# Patient Record
Sex: Male | Born: 1940 | Race: White | Hispanic: No | Marital: Married | Smoking: Light tobacco smoker
Health system: Southern US, Community
[De-identification: ages and names within clinical notes are randomized; demographics above are authoritative.]

## PROBLEM LIST (undated history)

## (undated) DIAGNOSIS — I10 Essential (primary) hypertension: Secondary | ICD-10-CM

## (undated) DIAGNOSIS — T8859XA Other complications of anesthesia, initial encounter: Secondary | ICD-10-CM

## (undated) DIAGNOSIS — F419 Anxiety disorder, unspecified: Secondary | ICD-10-CM

## (undated) DIAGNOSIS — M1990 Unspecified osteoarthritis, unspecified site: Secondary | ICD-10-CM

## (undated) DIAGNOSIS — J302 Other seasonal allergic rhinitis: Secondary | ICD-10-CM

## (undated) DIAGNOSIS — T4145XA Adverse effect of unspecified anesthetic, initial encounter: Secondary | ICD-10-CM

## (undated) DIAGNOSIS — F0390 Unspecified dementia without behavioral disturbance: Secondary | ICD-10-CM

## (undated) HISTORY — PX: OTHER SURGICAL HISTORY: SHX169

## (undated) HISTORY — PX: CATARACT EXTRACTION, BILATERAL: SHX1313

## (undated) HISTORY — DX: Unspecified dementia, unspecified severity, without behavioral disturbance, psychotic disturbance, mood disturbance, and anxiety: F03.90

---

## 2013-02-25 ENCOUNTER — Inpatient Hospital Stay (HOSPITAL_COMMUNITY)
Admission: EM | Admit: 2013-02-25 | Discharge: 2013-03-01 | DRG: 481 | Disposition: A | Discharge status: Skilled Nursing Facility | Payer: Medicare Other | Attending: Internal Medicine | Admitting: Internal Medicine

## 2013-02-25 ENCOUNTER — Inpatient Hospital Stay (HOSPITAL_COMMUNITY): Payer: Medicare Other

## 2013-02-25 ENCOUNTER — Emergency Department (HOSPITAL_COMMUNITY): Payer: Medicare Other

## 2013-02-25 ENCOUNTER — Inpatient Hospital Stay (HOSPITAL_COMMUNITY): Payer: Medicare Other | Admitting: Anesthesiology

## 2013-02-25 ENCOUNTER — Encounter (HOSPITAL_COMMUNITY): Payer: Self-pay | Admitting: Emergency Medicine

## 2013-02-25 ENCOUNTER — Encounter (HOSPITAL_COMMUNITY): Payer: Self-pay | Admitting: Anesthesiology

## 2013-02-25 ENCOUNTER — Encounter (HOSPITAL_COMMUNITY)
Admission: EM | Disposition: A | Discharge status: Skilled Nursing Facility | Payer: Self-pay | Source: Home / Self Care | Attending: Internal Medicine

## 2013-02-25 DIAGNOSIS — I10 Essential (primary) hypertension: Secondary | ICD-10-CM | POA: Diagnosis present

## 2013-02-25 DIAGNOSIS — S72143A Displaced intertrochanteric fracture of unspecified femur, initial encounter for closed fracture: Principal | ICD-10-CM

## 2013-02-25 DIAGNOSIS — R410 Disorientation, unspecified: Secondary | ICD-10-CM

## 2013-02-25 DIAGNOSIS — N179 Acute kidney failure, unspecified: Secondary | ICD-10-CM | POA: Diagnosis present

## 2013-02-25 DIAGNOSIS — I498 Other specified cardiac arrhythmias: Secondary | ICD-10-CM | POA: Diagnosis not present

## 2013-02-25 DIAGNOSIS — E876 Hypokalemia: Secondary | ICD-10-CM | POA: Diagnosis present

## 2013-02-25 DIAGNOSIS — D72829 Elevated white blood cell count, unspecified: Secondary | ICD-10-CM | POA: Diagnosis present

## 2013-02-25 DIAGNOSIS — R404 Transient alteration of awareness: Secondary | ICD-10-CM | POA: Diagnosis not present

## 2013-02-25 DIAGNOSIS — S72142A Displaced intertrochanteric fracture of left femur, initial encounter for closed fracture: Secondary | ICD-10-CM

## 2013-02-25 DIAGNOSIS — Z79899 Other long term (current) drug therapy: Secondary | ICD-10-CM

## 2013-02-25 DIAGNOSIS — W11XXXA Fall on and from ladder, initial encounter: Secondary | ICD-10-CM | POA: Diagnosis present

## 2013-02-25 DIAGNOSIS — F17200 Nicotine dependence, unspecified, uncomplicated: Secondary | ICD-10-CM | POA: Diagnosis present

## 2013-02-25 DIAGNOSIS — E871 Hypo-osmolality and hyponatremia: Secondary | ICD-10-CM | POA: Diagnosis present

## 2013-02-25 DIAGNOSIS — S72002A Fracture of unspecified part of neck of left femur, initial encounter for closed fracture: Secondary | ICD-10-CM

## 2013-02-25 DIAGNOSIS — Z880 Allergy status to penicillin: Secondary | ICD-10-CM

## 2013-02-25 DIAGNOSIS — F0390 Unspecified dementia without behavioral disturbance: Secondary | ICD-10-CM | POA: Diagnosis present

## 2013-02-25 DIAGNOSIS — Y92009 Unspecified place in unspecified non-institutional (private) residence as the place of occurrence of the external cause: Secondary | ICD-10-CM

## 2013-02-25 HISTORY — PX: FEMUR IM NAIL: SHX1597

## 2013-02-25 HISTORY — DX: Essential (primary) hypertension: I10

## 2013-02-25 LAB — COMPREHENSIVE METABOLIC PANEL
AST: 38 U/L — ABNORMAL HIGH (ref 0–37)
Albumin: 3.7 g/dL (ref 3.5–5.2)
BUN: 41 mg/dL — ABNORMAL HIGH (ref 6–23)
Creatinine, Ser: 1.38 mg/dL — ABNORMAL HIGH (ref 0.50–1.35)
Potassium: 3.4 mEq/L — ABNORMAL LOW (ref 3.5–5.1)
Total Protein: 7.4 g/dL (ref 6.0–8.3)

## 2013-02-25 LAB — CBC
HCT: 34.1 % — ABNORMAL LOW (ref 39.0–52.0)
MCHC: 39.3 g/dL — ABNORMAL HIGH (ref 30.0–36.0)
MCV: 95.5 fL (ref 78.0–100.0)
Platelets: 319 10*3/uL (ref 150–400)
RDW: 11.2 % — ABNORMAL LOW (ref 11.5–15.5)
WBC: 14.7 10*3/uL — ABNORMAL HIGH (ref 4.0–10.5)

## 2013-02-25 LAB — ABO/RH: ABO/RH(D): B POS

## 2013-02-25 LAB — URINALYSIS, ROUTINE W REFLEX MICROSCOPIC
Glucose, UA: NEGATIVE mg/dL
Hgb urine dipstick: NEGATIVE
Leukocytes, UA: NEGATIVE
pH: 6 (ref 5.0–8.0)

## 2013-02-25 LAB — TYPE AND SCREEN
ABO/RH(D): B POS
Antibody Screen: NEGATIVE

## 2013-02-25 LAB — PROTIME-INR
INR: 0.92 (ref 0.00–1.49)
Prothrombin Time: 12.3 seconds (ref 11.6–15.2)

## 2013-02-25 LAB — POCT I-STAT, CHEM 8
Creatinine, Ser: 1.1 mg/dL (ref 0.50–1.35)
Hemoglobin: 11.2 g/dL — ABNORMAL LOW (ref 13.0–17.0)
Sodium: 124 mEq/L — ABNORMAL LOW (ref 135–145)
TCO2: 24 mmol/L (ref 0–100)

## 2013-02-25 SURGERY — INSERTION, INTRAMEDULLARY ROD, FEMUR
Anesthesia: General | Site: Hip | Laterality: Left | Wound class: Clean

## 2013-02-25 MED ORDER — ENOXAPARIN SODIUM 40 MG/0.4ML ~~LOC~~ SOLN
40.0000 mg | SUBCUTANEOUS | Status: DC
  Administered 2013-02-26 – 2013-03-01 (×4): 40 mg via SUBCUTANEOUS
  Filled 2013-02-25 (×5): qty 0.4

## 2013-02-25 MED ORDER — MORPHINE SULFATE 2 MG/ML IJ SOLN: 0.5000 mg | INTRAMUSCULAR | Status: DC | PRN

## 2013-02-25 MED ORDER — PROMETHAZINE HCL 25 MG/ML IJ SOLN: 6.2500 mg | INTRAMUSCULAR | Status: DC | PRN

## 2013-02-25 MED ORDER — SODIUM CHLORIDE 0.9 % IV SOLN
1000.0000 mL | INTRAVENOUS | Status: DC
  Administered 2013-02-25: 1000 mL via INTRAVENOUS

## 2013-02-25 MED ORDER — ACETAMINOPHEN 325 MG PO TABS: 650.0000 mg | ORAL_TABLET | Freq: Four times a day (QID) | ORAL | Status: DC | PRN

## 2013-02-25 MED ORDER — LACTATED RINGERS IV SOLN: INTRAVENOUS | Status: DC

## 2013-02-25 MED ORDER — PHENOL 1.4 % MT LIQD: 1.0000 | OROMUCOSAL | Status: DC | PRN

## 2013-02-25 MED ORDER — ACETAMINOPHEN 650 MG RE SUPP: 650.0000 mg | Freq: Four times a day (QID) | RECTAL | Status: DC | PRN

## 2013-02-25 MED ORDER — POTASSIUM CHLORIDE CRYS ER 20 MEQ PO TBCR
40.0000 meq | EXTENDED_RELEASE_TABLET | Freq: Once | ORAL | Status: AC
  Administered 2013-02-25: 40 meq via ORAL
  Filled 2013-02-25: qty 2

## 2013-02-25 MED ORDER — HYDROCODONE-ACETAMINOPHEN 5-325 MG PO TABS: 1.0000 | ORAL_TABLET | Freq: Four times a day (QID) | ORAL | Status: DC | PRN

## 2013-02-25 MED ORDER — ONDANSETRON HCL 4 MG/2ML IJ SOLN: 4.0000 mg | Freq: Four times a day (QID) | INTRAMUSCULAR | Status: DC | PRN

## 2013-02-25 MED ORDER — FENTANYL CITRATE 0.05 MG/ML IJ SOLN
INTRAMUSCULAR | Status: AC
  Filled 2013-02-25: qty 2

## 2013-02-25 MED ORDER — PHENYLEPHRINE HCL 10 MG/ML IJ SOLN
10.0000 mg | INTRAVENOUS | Status: DC | PRN
  Administered 2013-02-25: 20 ug/min via INTRAVENOUS

## 2013-02-25 MED ORDER — LABETALOL HCL 5 MG/ML IV SOLN
5.0000 mg | INTRAVENOUS | Status: DC | PRN
  Administered 2013-02-25 (×3): 5 mg via INTRAVENOUS

## 2013-02-25 MED ORDER — PROPOFOL 10 MG/ML IV BOLUS
INTRAVENOUS | Status: DC | PRN
  Administered 2013-02-25: 160 mg via INTRAVENOUS

## 2013-02-25 MED ORDER — ONDANSETRON HCL 4 MG/2ML IJ SOLN
INTRAMUSCULAR | Status: DC | PRN
  Administered 2013-02-25: 4 mg via INTRAVENOUS

## 2013-02-25 MED ORDER — METOCLOPRAMIDE HCL 10 MG PO TABS: 5.0000 mg | ORAL_TABLET | Freq: Three times a day (TID) | ORAL | Status: DC | PRN

## 2013-02-25 MED ORDER — PHENYLEPHRINE HCL 10 MG/ML IJ SOLN
INTRAMUSCULAR | Status: DC | PRN
  Administered 2013-02-25 (×2): 240 ug via INTRAVENOUS

## 2013-02-25 MED ORDER — LACTATED RINGERS IV SOLN
INTRAVENOUS | Status: DC | PRN
  Administered 2013-02-25 (×2): via INTRAVENOUS

## 2013-02-25 MED ORDER — MENTHOL 3 MG MT LOZG: 1.0000 | LOZENGE | OROMUCOSAL | Status: DC | PRN

## 2013-02-25 MED ORDER — ACETAMINOPHEN 10 MG/ML IV SOLN
INTRAVENOUS | Status: AC
  Filled 2013-02-25: qty 100

## 2013-02-25 MED ORDER — CLINDAMYCIN PHOSPHATE 900 MG/50ML IV SOLN
INTRAVENOUS | Status: AC
  Filled 2013-02-25: qty 50

## 2013-02-25 MED ORDER — METOCLOPRAMIDE HCL 5 MG/ML IJ SOLN: 5.0000 mg | Freq: Three times a day (TID) | INTRAMUSCULAR | Status: DC | PRN

## 2013-02-25 MED ORDER — FENTANYL CITRATE 0.05 MG/ML IJ SOLN
25.0000 ug | INTRAMUSCULAR | Status: DC | PRN
  Administered 2013-02-25: 25 ug via INTRAVENOUS

## 2013-02-25 MED ORDER — HYDRALAZINE HCL 20 MG/ML IJ SOLN
10.0000 mg | Freq: Four times a day (QID) | INTRAMUSCULAR | Status: DC | PRN
  Administered 2013-02-27 – 2013-02-28 (×3): 10 mg via INTRAVENOUS
  Filled 2013-02-25 (×2): qty 1
  Filled 2013-02-25: qty 0.5
  Filled 2013-02-25: qty 1

## 2013-02-25 MED ORDER — FENTANYL CITRATE 0.05 MG/ML IJ SOLN
INTRAMUSCULAR | Status: DC | PRN
  Administered 2013-02-25 (×3): 50 ug via INTRAVENOUS

## 2013-02-25 MED ORDER — LABETALOL HCL 5 MG/ML IV SOLN
INTRAVENOUS | Status: AC
  Filled 2013-02-25: qty 4

## 2013-02-25 MED ORDER — ONDANSETRON HCL 4 MG PO TABS: 4.0000 mg | ORAL_TABLET | Freq: Four times a day (QID) | ORAL | Status: DC | PRN

## 2013-02-25 MED ORDER — SODIUM CHLORIDE 0.9 % IV BOLUS (SEPSIS)
1000.0000 mL | Freq: Once | INTRAVENOUS | Status: AC
  Administered 2013-02-25: 1000 mL via INTRAVENOUS

## 2013-02-25 MED ORDER — CLINDAMYCIN PHOSPHATE 900 MG/50ML IV SOLN
900.0000 mg | Freq: Once | INTRAVENOUS | Status: AC
  Administered 2013-02-25: 900 mg via INTRAVENOUS

## 2013-02-25 MED ORDER — LIDOCAINE HCL (CARDIAC) 20 MG/ML IV SOLN
INTRAVENOUS | Status: DC | PRN
  Administered 2013-02-25: 50 mg via INTRAVENOUS

## 2013-02-25 MED ORDER — SUCCINYLCHOLINE CHLORIDE 20 MG/ML IJ SOLN
INTRAMUSCULAR | Status: DC | PRN
  Administered 2013-02-25: 100 mg via INTRAVENOUS

## 2013-02-25 MED ORDER — LOVENOX 40 MG/0.4ML ~~LOC~~ SOLN: 40.0000 mg | SUBCUTANEOUS | Status: DC

## 2013-02-25 MED ORDER — POTASSIUM CHLORIDE IN NACL 20-0.9 MEQ/L-% IV SOLN
INTRAVENOUS | Status: DC
  Administered 2013-02-26: via INTRAVENOUS
  Filled 2013-02-25 (×2): qty 1000

## 2013-02-25 MED ORDER — ALBUTEROL SULFATE HFA 108 (90 BASE) MCG/ACT IN AERS
INHALATION_SPRAY | RESPIRATORY_TRACT | Status: AC
  Filled 2013-02-25: qty 6.7

## 2013-02-25 MED ORDER — LIDOCAINE HCL 4 % MT SOLN
OROMUCOSAL | Status: DC | PRN
  Administered 2013-02-25: 4 mL via TOPICAL

## 2013-02-25 MED ORDER — CLINDAMYCIN PHOSPHATE 600 MG/50ML IV SOLN
600.0000 mg | Freq: Four times a day (QID) | INTRAVENOUS | Status: AC
  Administered 2013-02-26 (×2): 600 mg via INTRAVENOUS
  Filled 2013-02-25 (×2): qty 50

## 2013-02-25 SURGICAL SUPPLY — 40 items
BAG ZIPLOCK 12X15 (MISCELLANEOUS) ×2 IMPLANT
BANDAGE GAUZE ELAST BULKY 4 IN (GAUZE/BANDAGES/DRESSINGS) ×2 IMPLANT
BIT DRILL CANN LG 4.3MM (BIT) ×1 IMPLANT
BNDG COHESIVE 6X5 TAN STRL LF (GAUZE/BANDAGES/DRESSINGS) ×2 IMPLANT
CLOTH BEACON ORANGE TIMEOUT ST (SAFETY) IMPLANT
DRAPE C-ARM 42X72 X-RAY (DRAPES) IMPLANT
DRAPE STERI IOBAN 125X83 (DRAPES) ×2 IMPLANT
DRILL BIT CANN LG 4.3MM (BIT) ×2
DRSG MEPILEX BORDER 4X4 (GAUZE/BANDAGES/DRESSINGS) ×2 IMPLANT
DRSG MEPILEX BORDER 4X8 (GAUZE/BANDAGES/DRESSINGS) ×2 IMPLANT
DRSG PAD ABDOMINAL 8X10 ST (GAUZE/BANDAGES/DRESSINGS) ×2 IMPLANT
DURAPREP 26ML APPLICATOR (WOUND CARE) ×2 IMPLANT
ELECT REM PT RETURN 9FT ADLT (ELECTROSURGICAL) ×2
ELECTRODE REM PT RTRN 9FT ADLT (ELECTROSURGICAL) ×1 IMPLANT
GLOVE BIOGEL PI IND STRL 7.5 (GLOVE) IMPLANT
GLOVE BIOGEL PI IND STRL 8 (GLOVE) IMPLANT
GLOVE BIOGEL PI INDICATOR 7.5 (GLOVE)
GLOVE BIOGEL PI INDICATOR 8 (GLOVE)
GLOVE SURG SS PI 7.5 STRL IVOR (GLOVE) ×2 IMPLANT
GLOVE SURG SS PI 8.0 STRL IVOR (GLOVE) IMPLANT
GOWN PREVENTION PLUS LG XLONG (DISPOSABLE) ×2 IMPLANT
GOWN PREVENTION PLUS XLARGE (GOWN DISPOSABLE) IMPLANT
GOWN STRL REIN XL XLG (GOWN DISPOSABLE) ×2 IMPLANT
GUIDEPIN 3.2X17.5 THRD DISP (PIN) ×2 IMPLANT
HIP FRAC NAIL LAG SCR 10.5X100 (Orthopedic Implant) ×1 IMPLANT
KIT BASIN OR (CUSTOM PROCEDURE TRAY) ×2 IMPLANT
MANIFOLD NEPTUNE II (INSTRUMENTS) ×2 IMPLANT
NAIL HIP FRACT 130D 11X180 (Screw) ×2 IMPLANT
PACK GENERAL/GYN (CUSTOM PROCEDURE TRAY) ×2 IMPLANT
PAD CAST 4YDX4 CTTN HI CHSV (CAST SUPPLIES) ×1 IMPLANT
PADDING CAST COTTON 4X4 STRL (CAST SUPPLIES) ×1
SCREW BONE CORTICAL 5.0X38 (Screw) ×2 IMPLANT
SCREW CANN THRD AFF 10.5X100 (Orthopedic Implant) ×1 IMPLANT
SPONGE GAUZE 4X4 12PLY (GAUZE/BANDAGES/DRESSINGS) ×2 IMPLANT
STAPLER VISISTAT (STAPLE) ×2 IMPLANT
SUT VIC AB 0 CT1 27 (SUTURE) ×2
SUT VIC AB 0 CT1 27XBRD ANTBC (SUTURE) ×2 IMPLANT
SUT VIC AB 2-0 CT1 27 (SUTURE) ×1
SUT VIC AB 2-0 CT1 TAPERPNT 27 (SUTURE) ×1 IMPLANT
TRAY FOLEY CATH 14FRSI W/METER (CATHETERS) ×2 IMPLANT

## 2013-02-25 NOTE — Consult Note (Signed)
Reason for Consult: Left Hip Fracture Referring Physician: EDP  Timothy Pena is an 72 y.o. male.  HPI: Timothy Pena 3d ago. No LOC. Seen in ER. Called  Past Medical History  Diagnosis Date  . Hypertension     Past Surgical History  Procedure Laterality Date  . Cataract extraction, bilateral    . Dental implants      History reviewed. No pertinent family history.  Social History:  reports that he has been smoking Cigarettes.  He has been smoking about 0.00 packs per day. He has never used smokeless tobacco. He reports that  drinks alcohol. He reports that he does not use illicit drugs.  Allergies:  Allergies  Allergen Reactions  . Penicillins Swelling    Throat swelling    Medications: I have reviewed the patient's current medications.  Results for orders placed during the hospital encounter of 02/25/13 (from the past 48 hour(s))  CBC     Status: Abnormal   Collection Time    02/25/13  9:40 AM      Result Value Range   WBC 14.7 (*) 4.0 - 10.5 K/uL   RBC 3.57 (*) 4.22 - 5.81 MIL/uL   Hemoglobin 13.4  13.0 - 17.0 g/dL   HCT 16.1 (*) 09.6 - 04.5 %   MCV 95.5  78.0 - 100.0 fL   MCH 37.5 (*) 26.0 - 34.0 pg   MCHC 39.3 (*) 30.0 - 36.0 g/dL   Comment: RULED OUT INTERFERING SUBSTANCES   RDW 11.2 (*) 11.5 - 15.5 %   Platelets 319  150 - 400 K/uL  COMPREHENSIVE METABOLIC PANEL     Status: Abnormal   Collection Time    02/25/13  9:40 AM      Result Value Range   Sodium 117 (*) 135 - 145 mEq/L   Comment: CRITICAL RESULT CALLED TO, READ BACK BY AND VERIFIED WITH:     T. HAMPTON RN AT 1100 ON 03.20.14 BY SHUEA   Potassium 3.4 (*) 3.5 - 5.1 mEq/L   Chloride 79 (*) 96 - 112 mEq/L   CO2 22  19 - 32 mEq/L   Glucose, Bld 166 (*) 70 - 99 mg/dL   BUN 41 (*) 6 - 23 mg/dL   Creatinine, Ser 4.09 (*) 0.50 - 1.35 mg/dL   Calcium 81.1  8.4 - 91.4 mg/dL   Total Protein 7.4  6.0 - 8.3 g/dL   Albumin 3.7  3.5 - 5.2 g/dL   AST 38 (*) 0 - 37 U/L   ALT 19  0 - 53 U/L   Alkaline Phosphatase 72   39 - 117 U/L   Total Bilirubin 0.5  0.3 - 1.2 mg/dL   GFR calc non Af Amer 50 (*) >90 mL/min   GFR calc Af Amer 58 (*) >90 mL/min   Comment:            The eGFR has been calculated     using the CKD EPI equation.     This calculation has not been     validated in all clinical     situations.     eGFR's persistently     <90 mL/min signify     possible Chronic Kidney Disease.  PROTIME-INR     Status: None   Collection Time    02/25/13 10:55 AM      Result Value Range   Prothrombin Time 12.3  11.6 - 15.2 seconds   INR 0.92  0.00 - 1.49    Dg Hip Complete  Left  02/25/2013  *RADIOLOGY REPORT*  Clinical Data: Timothy Pena.  Left hip pain.  LEFT HIP - COMPLETE 2+ VIEW  Comparison: None  Findings: There is a mildly displaced comminuted intertrochanteric fracture of the left hip.  Mild displacement of the greater and lesser trochanters.  The right hip is normal.  The pubic symphysis and SI joints are intact.  IMPRESSION: Intertrochanteric fracture left hip.   Original Report Authenticated By: Rudie Meyer, M.D.    Ct Head Wo Contrast  02/25/2013  *RADIOLOGY REPORT*  Clinical Data: Fall.  Confusion.  Hip pain.  CT HEAD WITHOUT CONTRAST  Technique:  Contiguous axial images were obtained from the base of the skull through the vertex without contrast.  Comparison: None.  Findings: The brain stem, cerebellum, cerebral peduncles, thalami, basal ganglia, basilar cisterns, and ventricular system appear unremarkable.  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  No intracranial hemorrhage, mass lesion, or acute infarction is identified.  There is opacification of several posterior ethmoid air cells. Atherosclerotic calcification of the carotid siphons noted.  IMPRESSION:  1. 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 2.   Chronic ethmoid sinusitis. 3.  No acute intracranial findings.   Original Report Authenticated By: Gaylyn Rong, M.D.     Review of Systems  Constitutional: Negative.   HENT: Negative.   Eyes: Negative.   Respiratory: Negative.   Cardiovascular: Negative.   Gastrointestinal: Negative.   Genitourinary: Negative.   Musculoskeletal: Positive for joint pain.  Skin: Negative.   Neurological: Negative.   Endo/Heme/Allergies: Negative.   Psychiatric/Behavioral: Negative.   All other systems reviewed and are negative.   Blood pressure 126/89, pulse 95, temperature 98.5 F (36.9 C), temperature source Rectal, resp. rate 22, SpO2 95.00%. Physical Exam  Vitals reviewed. Constitutional: He is oriented to person, place, and time. He appears well-developed.  HENT:  Head: Normocephalic.  Eyes: Pupils are equal, round, and reactive to light.  Neck: Normal range of motion.  Cardiovascular: Normal rate.   Respiratory: Effort normal.  GI: Soft.  Musculoskeletal:  Short ER left leg. Painful ROM. No DVT. 1+DP PT  Neurological: He is alert and oriented to person, place, and time.  Skin: Skin is warm and dry.  Psychiatric: He has a normal mood and affect.    Assessment/Plan: Left comminuted IT hip fracture.  Hyponatremia. Plan IM nail following normalized Na.  Sandrine Bloodsworth C 02/25/2013, 1:03 PM

## 2013-02-25 NOTE — ED Notes (Signed)
Per EMS-- pt was unable to answer their questions appropriately. Disoriented to year. According to pt spouse pt has been sitting in the same chair since yesterday. Usually able to stand with a walker this am was unable to stand alone. States that he fell Monday and hurt his left hip.

## 2013-02-25 NOTE — ED Notes (Signed)
[id number]> Expected date:<BR> Expected time:<BR> Means of arrival:<BR> Comments:<BR> ALOC

## 2013-02-25 NOTE — Anesthesia Postprocedure Evaluation (Signed)
Anesthesia Post Note  Patient: Timothy Pena  Procedure(s) Performed: Procedure(s) (LRB): INTRAMEDULLARY (IM) NAIL FEMORAL (Left)  Anesthesia type: General  Patient location: PACU  Post pain: Pain level controlled  Post assessment: Post-op Vital signs reviewed  Last Vitals:  Filed Vitals:   02/25/13 1900  BP: 166/81  Pulse: 80  Temp: 37.8 C  Resp: 10    Post vital signs: Reviewed  Level of consciousness: sedated  Complications: No apparent anesthesia complications

## 2013-02-25 NOTE — ED Notes (Signed)
Spouse states that pt has been sitting on the couch since Tuesday. Pt has a lot of pain with movement of left hip.

## 2013-02-25 NOTE — Anesthesia Preprocedure Evaluation (Addendum)
Anesthesia Evaluation  Patient identified by MRN, date of birth, ID band Patient awake    Reviewed: Allergy & Precautions, H&P , NPO status , Patient's Chart, lab work & pertinent test results  Airway Mallampati: II TM Distance: >3 FB Neck ROM: Full    Dental  (+) Edentulous Upper and Edentulous Lower   Pulmonary Current Smoker,  breath sounds clear to auscultation        Cardiovascular hypertension, Rhythm:Regular Rate:Tachycardia     Neuro/Psych negative neurological ROS  negative psych ROS   GI/Hepatic negative GI ROS, Neg liver ROS,   Endo/Other  negative endocrine ROS  Renal/GU ARFRenal diseaseHyponatremia  negative genitourinary   Musculoskeletal negative musculoskeletal ROS (+)   Abdominal   Peds negative pediatric ROS (+)  Hematology negative hematology ROS (+)   Anesthesia Other Findings   Reproductive/Obstetrics negative OB ROS                          Anesthesia Physical Anesthesia Plan  ASA: III and emergent  Anesthesia Plan: General   Post-op Pain Management:    Induction: Intravenous  Airway Management Planned: Oral ETT  Additional Equipment:   Intra-op Plan:   Post-operative Plan: Extubation in OR  Informed Consent: I have reviewed the patients History and Physical, chart, labs and discussed the procedure including the risks, benefits and alternatives for the proposed anesthesia with the patient or authorized representative who has indicated his/her understanding and acceptance.   Dental advisory given  Plan Discussed with: CRNA  Anesthesia Plan Comments:         Anesthesia Quick Evaluation

## 2013-02-25 NOTE — ED Notes (Signed)
Attempted report 

## 2013-02-25 NOTE — ED Notes (Signed)
Patient transported to X-ray and returned 

## 2013-02-25 NOTE — Transfer of Care (Signed)
Immediate Anesthesia Transfer of Care Note  Patient: Timothy Pena  Procedure(s) Performed: Procedure(s): INTRAMEDULLARY (IM) NAIL FEMORAL (Left)  Patient Location: PACU  Anesthesia Type:General  Level of Consciousness: awake, alert  and oriented  Airway & Oxygen Therapy: Patient Spontanous Breathing and Patient connected to face mask oxygen  Post-op Assessment: Report given to PACU RN and Post -op Vital signs reviewed and stable  Post vital signs: stable  Complications: No apparent anesthesia complications

## 2013-02-25 NOTE — Progress Notes (Signed)
Pt confirms pcp is [facility] [address] Maurice Small EPIC updated

## 2013-02-25 NOTE — Brief Op Note (Signed)
02/25/2013  5:33 PM  PATIENT:  Timothy Pena  72 y.o. male  PRE-OPERATIVE DIAGNOSIS:  left hip fracture  POST-OPERATIVE DIAGNOSIS:  left hip fracture  PROCEDURE:  Procedure(s): INTRAMEDULLARY (IM) NAIL FEMORAL (Left)  SURGEON:  Surgeon(s) and Role:    * Javier Docker, MD - Primary  PHYSICIAN ASSISTANT:   ASSISTANTS: Bissell   ANESTHESIA:   general  EBL:  Total I/O In: 1000 [I.V.:1000] Out: 1250 [Urine:1050; Blood:200]  BLOOD ADMINISTERED:none  DRAINS: none   LOCAL MEDICATIONS USED:  NONE  SPECIMEN:  No Specimen  DISPOSITION OF SPECIMEN:  N/A  COUNTS:  YES  TOURNIQUET:  * No tourniquets in log *  DICTATION: .Other Dictation: Dictation Number [id number]  PLAN OF CARE: Admit to inpatient   PATIENT DISPOSITION:  PACU - hemodynamically stable.   Delay start of Pharmacological VTE agent (>24hrs) due to surgical blood loss or risk of bleeding: no

## 2013-02-25 NOTE — ED Notes (Signed)
Critical Value given to Dr. Effie Shy

## 2013-02-25 NOTE — H&P (Addendum)
[facility] History and Physical  Timothy Pena ZOX:[id number] DOB: 02-05-1941 DOA: 02/25/2013  Referring physician: ER physician PCP: Astrid Divine, MD   Chief Complaint: Fall, hip fracture  HPI:  72 year old male with past medical history significant for hypertension who presented to [facility] ED 02/25/13 status post fall at home 3 days prior to this admission. Patient fell from a 4 foot ladder but has not sought medical care at that time. Patient was found by his significant other sitting on a couch. Patient was complaining of pain in the pelvic area. Patient reports no lightheadedness or loss of consciousness. No complaints of chest pain, shortness of breath or palpitations. No fever or chills. No abdominal pain or nausea or vomiting. In ED, evaluation included x-ray of the left hip which revealed left intertrochanteric hip fracture. Orthopedic surgery was consulted by ED physician. Further evaluation included CBC which revealed leukocytosis with white blood cell count of 14.7. BMET revealed hyponatremia with sodium level of 117, potassium 3.4 and creatinine elevated at 1.38. There are no previous values for comparison.  Assessment and plan:  Principal Problem:   *Hip fracture, left  Left intertrochanteric hip fracture  Appreciate orthopedic surgery consultation, manage per ortho  IV fluids, analgesia Active Problems:   Leukocytosis  Unclear etiology, possibly reactive  Awaiting urinalysis results, chest x-ray results   Hyponatremia  Likely secondary to dehydration  Continue IV fluids  Followup BMP in the morning   Hypokalemia  Secondary to dehydration  Repleted in emergency room  Followup BMP in the morning   Acute kidney failure  Likely prerenal, due to dehydration versus ACE inhibitors  Continue IV fluids and follow up renal function in the morning  Hold ACEi, will use hydralazine PRN for BP control Hypertension  ACEi; use hydralazine PRN  Code  Status: Full Family Communication: Pt at bedside Disposition Plan: Admit for further evaluation  Manson Passey, MD  [facility] Pager [phone]  If 7PM-7AM, please contact night-coverage [email or web address] Password [email or web address] 02/25/2013, 1:30 PM  Review of Systems:  Constitutional: Negative for fever, chills and malaise/fatigue. Negative for diaphoresis.  HENT: Negative for hearing loss, ear pain, nosebleeds, congestion, sore throat, neck pain, tinnitus and ear discharge.   Eyes: Negative for blurred vision, double vision, photophobia, pain, discharge and redness.  Respiratory: Negative for cough, hemoptysis, sputum production, shortness of breath, wheezing and stridor.   Cardiovascular: Negative for chest pain, palpitations, orthopnea, claudication and leg swelling.  Gastrointestinal: Negative for nausea, vomiting and abdominal pain. Negative for heartburn, constipation, blood in stool and melena.  Genitourinary: Negative for dysuria, urgency, frequency, hematuria and flank pain.  Musculoskeletal: pr hpi.  Skin: Negative for itching and rash.  Neurological: Negative for dizziness and weakness. Negative for tingling, tremors, sensory change, speech change, focal weakness, loss of consciousness and headaches.  Endo/Heme/Allergies: Negative for environmental allergies and polydipsia. Does not bruise/bleed easily.  Psychiatric/Behavioral: Negative for suicidal ideas. The patient is not nervous/anxious.      Past Medical History  Diagnosis Date  . Hypertension    Past Surgical History  Procedure Laterality Date  . Cataract extraction, bilateral    . Dental implants     Social History:  reports that he has been smoking Cigarettes.  He has been smoking about 0.00 packs per day. He has never used smokeless tobacco. He reports that  drinks alcohol. He reports that he does not use illicit drugs.  Allergies  Allergen Reactions  . Penicillins Swelling    Throat swelling    Family History: htn  in  mother  Prior to Admission medications   Medication Sig Start Date End Date Taking? Authorizing Provider  benazepril-hydrochlorthiazide (LOTENSIN HCT) 10-12.5 MG per tablet Take 1 tablet by mouth daily.   Yes Historical Provider, MD  naproxen sodium (ANAPROX) 220 MG tablet Take 440 mg by mouth 2 (two) times daily as needed (for pain).   Yes Historical Provider, MD   Physical Exam: Filed Vitals:   02/25/13 0946  BP: 126/89  Pulse: 95  Temp: 98.5 F (36.9 C)  TempSrc: Rectal  Resp: 22  SpO2: 95%    Physical Exam  Constitutional: Appears well-developed and well-nourished. No distress.  HENT: Normocephalic. External right and left ear normal. Oropharynx is clear and moist.  Eyes: Conjunctivae and EOM are normal. PERRLA, no scleral icterus.  Neck: Normal ROM. Neck supple. No JVD. No tracheal deviation. No thyromegaly.  CVS: RRR, S1/S2 +, no murmurs, no gallops, no carotid bruit.  Pulmonary: Effort and breath sounds normal, no stridor, rhonchi, wheezes, rales.  Abdominal: Soft. BS +,  no distension, tenderness, rebound or guarding.  Musculoskeletal: Normal range of motion. No edema and no tenderness.  Lymphadenopathy: No lymphadenopathy noted, cervical, inguinal. Neuro: Alert. Normal reflexes, muscle tone coordination. No cranial nerve deficit. Skin: Skin is warm and dry. No rash noted. Not diaphoretic. No erythema. No pallor.  Psychiatric: Normal mood and affect. Behavior, judgment, thought content normal.   Labs on Admission:  Basic Metabolic Panel:  Recent Labs Lab 02/25/13 0940  NA 117*  K 3.4*  CL 79*  CO2 22  GLUCOSE 166*  BUN 41*  CREATININE 1.38*  CALCIUM 10.0   Liver Function Tests:  Recent Labs Lab 02/25/13 0940  AST 38*  ALT 19  ALKPHOS 72  BILITOT 0.5  PROT 7.4  ALBUMIN 3.7   No results found for this basename: LIPASE, AMYLASE,  in the last 168 hours No results found for this basename: AMMONIA,  in the last 168 hours CBC:  Recent Labs Lab  02/25/13 0940  WBC 14.7*  HGB 13.4  HCT 34.1*  MCV 95.5  PLT 319   Cardiac Enzymes: No results found for this basename: CKTOTAL, CKMB, CKMBINDEX, TROPONINI,  in the last 168 hours BNP: No components found with this basename: POCBNP,  CBG: No results found for this basename: GLUCAP,  in the last 168 hours  Radiological Exams on Admission: Dg Hip Complete Left 02/25/2013  * IMPRESSION: Intertrochanteric fracture left hip.   Original Report Authenticated By: Rudie Meyer, M.D.    Ct Head Wo Contrast 02/25/2013    IMPRESSION:  1. 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 2.  Chronic ethmoid sinusitis. 3.  No acute intracranial findings.   Original Report Authenticated By: Gaylyn Rong, M.D.     EKG: Normal sinus rhythm, no ST/T wave changes  Time spent: 75 minutes

## 2013-02-25 NOTE — Progress Notes (Signed)
CM noted CM consult for pt to be followed for fx hip. CM reviewed home health and Skilled nursing facilities with pt and spouse at bedside Spouse states she previously used [facility] for "out handicap son" and she "went to [facility] for rehab after my knee surgery" She states she will review these options with pt (pt noted to begin to doze during interaction) CM provided pt and wife with a list of [place] home health agencies and snfs to assist with plan of care  Choices offered  HOME HEALTH AGENCIES SERVING [place]   Agencies that are Medicare-Certified and are affiliated with [facility]  Telephone Number Address  [facility]   The [facility] has ownership interest in this company; however, you are under no obligation to use this agency. [phone] or  [phone] [address] [place], [place] [address] [email or web address]   Agencies that are Medicare-Certified and are not affiliated with [facility] Agency Telephone Number Address  [facility] [phone] Fax [phone] [address] [place], [place]  [address] [email or web address]  [facility] [phone] or [phone] Fax [phone] [address] [place], [place] [address] [email or web address]  [facility] [phone] Fax [phone] [address] [place], [place] [address] [email or web address]  [facility] [phone] Fax [phone] [address] [place], [place]  [address] [email or web address]  [facility] [phone] Fax [phone] [address], Suite [place], [place] [address] [email or web address]  [facility]  [phone] [address] [place], [place] [address] [email or web address]  [facility] [phone]  [address] [place], [place] [address] [email or web address]  [facility] [phone] or [phone] Fax number [phone] [address] [place], [place]  [address] [email or web address]  [facility] [phone] Fax [phone] [address] [place], [place]  [address]  [facility]  [phone] Fax [phone] [address] [place], [place] [address] [email or web address]

## 2013-02-25 NOTE — ED Provider Notes (Addendum)
Medical screening examination/treatment/procedure(s) were conducted as a shared visit with non-physician practitioner(s) and myself.  I personally evaluated the patient during the encounter  Ethelda Chick, MD 02/25/13 [phone]

## 2013-02-25 NOTE — ED Provider Notes (Signed)
History     CSN: [id number]  Arrival date & time 02/25/13  4098   First MD Initiated Contact with Patient 02/25/13 1040      Chief Complaint  Patient presents with  . Altered Mental Status  . Hip Injury  . Fall  . Urinary Incontinence    (Consider location/radiation/quality/duration/timing/severity/associated sxs/prior treatment) HPI Comments: 72 year old male with a past medical history of hypertension presents to the emergency department with his spouse due to altered mental status and immobility x 3 days. Wife does not live with patient, however neighbors called her 3 days ago on Monday to tell her patient was sitting on the couch after a fall. They tried to call EMS, however patient refused to go to the emergency department. The next day was wife went over to his house and he was still sitting on the couch. Patient states he was on a 4 foot ladder and fell, however states "it happened too fast" that he does not remember what happened. He is unsure if he hit his head. Wife states that there was some blood on the wall. Wife tried to get him off of the couch, however patient refused. She stayed overnight, and the next morning he was still in the same place. She then left yesterday evening and returned this morning to find them in the same place. States he seemed slightly altered and confused. When asking the patient if he has pain anywhere, he states "very little in my left hip". Triage summary states patient is disoriented to year, however on my examination he is able to tell me the current year.  Patient is a 72 y.o. male presenting with altered mental status and fall. The history is provided by the patient and the spouse. The history is limited by the condition of the patient.  Altered Mental Status  Fall    Past Medical History  Diagnosis Date  . Hypertension     History reviewed. No pertinent past surgical history.  No family history on file.  History  Substance Use Topics   . Smoking status: Current Every Day Smoker  . Smokeless tobacco: Not on file  . Alcohol Use: Yes      Review of Systems  Psychiatric/Behavioral: Positive for altered mental status.    Allergies  Penicillins  Home Medications   Current Outpatient Rx  Name  Route  Sig  Dispense  Refill  . benazepril-hydrochlorthiazide (LOTENSIN HCT) 10-12.5 MG per tablet   Oral   Take 1 tablet by mouth daily.         . naproxen sodium (ANAPROX) 220 MG tablet   Oral   Take 440 mg by mouth 2 (two) times daily as needed (for pain).           BP 126/89  Pulse 95  Temp(Src) 98.5 F (36.9 C) (Rectal)  Resp 22  SpO2 95%  Physical Exam  Nursing note and vitals reviewed. Constitutional: He is oriented to person, place, and time. He appears well-developed and well-nourished. No distress.  HENT:  Head: Normocephalic and atraumatic. Head is without raccoon's eyes, without Battle's sign, without abrasion and without contusion.  Right Ear: No hemotympanum.  Left Ear: No hemotympanum.  Mouth/Throat: Oropharynx is clear and moist.  Eyes: Conjunctivae and EOM are normal. Pupils are equal, round, and reactive to light.  Neck: Normal range of motion. Neck supple.  Cardiovascular: Normal rate, regular rhythm, normal heart sounds and intact distal pulses.   Pulses:      Dorsalis pedis  pulses are 2+ on the right side, and 2+ on the left side.       Posterior tibial pulses are 1+ on the right side, and 1+ on the left side.  Pulmonary/Chest: Effort normal and breath sounds normal. No respiratory distress.  Abdominal: Soft. Bowel sounds are normal. There is no tenderness.  Musculoskeletal:  Left lower tremor the externally rotated and shortened compared to right. Mild tenderness to palpation of lateral aspect of femur. Extreme pain produced with movement of hip.  Neurological: He is alert and oriented to person, place, and time. No cranial nerve deficit or sensory deficit.  Skin: Skin is warm and  dry. He is not diaphoretic.  Few abrasions to left arm. Non tender. No bleeding.  Psychiatric: He has a normal mood and affect. His speech is delayed. He is slowed. Cognition and memory are normal.    ED Course  Procedures (including critical care time)  Labs Reviewed  CBC - Abnormal; Notable for the following:    WBC 14.7 (*)    RBC 3.57 (*)    HCT 34.1 (*)    MCH 37.5 (*)    MCHC 39.3 (*)    RDW 11.2 (*)    All other components within normal limits  COMPREHENSIVE METABOLIC PANEL - Abnormal; Notable for the following:    Sodium 117 (*)    Potassium 3.4 (*)    Chloride 79 (*)    Glucose, Bld 166 (*)    BUN 41 (*)    Creatinine, Ser 1.38 (*)    AST 38 (*)    GFR calc non Af Amer 50 (*)    GFR calc Af Amer 58 (*)    All other components within normal limits  PROTIME-INR  URINALYSIS, ROUTINE W REFLEX MICROSCOPIC  TYPE AND SCREEN   Dg Hip Complete Left  02/25/2013  *RADIOLOGY REPORT*  Clinical Data: Larey Seat.  Left hip pain.  LEFT HIP - COMPLETE 2+ VIEW  Comparison: None  Findings: There is a mildly displaced comminuted intertrochanteric fracture of the left hip.  Mild displacement of the greater and lesser trochanters.  The right hip is normal.  The pubic symphysis and SI joints are intact.  IMPRESSION: Intertrochanteric fracture left hip.   Original Report Authenticated By: Rudie Meyer, M.D.    Ct Head Wo Contrast  02/25/2013  *RADIOLOGY REPORT*  Clinical Data: Fall.  Confusion.  Hip pain.  CT HEAD WITHOUT CONTRAST  Technique:  Contiguous axial images were obtained from the base of the skull through the vertex without contrast.  Comparison: None.  Findings: The brain stem, cerebellum, cerebral peduncles, thalami, basal ganglia, basilar cisterns, and ventricular system appear unremarkable.  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  No intracranial hemorrhage, mass lesion, or acute  infarction is identified.  There is opacification of several posterior ethmoid air cells. Atherosclerotic calcification of the carotid siphons noted.  IMPRESSION:  1. 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 2.  Chronic ethmoid sinusitis. 3.  No acute intracranial findings.   Original Report Authenticated By: Gaylyn Rong, M.D.     Date: 02/25/2013  Rate: 88  Rhythm: normal sinus rhythm  QRS Axis: normal  Intervals: normal  ST/T Wave abnormalities: normal  Conduction Disutrbances:incomplete RBBB  Narrative Interpretation: multiple premature complexes, ventricular, supraventricular  Old EKG Reviewed: none available    1. Intertrochanteric fracture of left femur, closed, initial  encounter   2. Hyponatremia       MDM  72 y/o male with intertrochanteric fracture of L hip. CT scan head without any acute abnormality. He is hyponatremic at 117 and has been given 2 L fluid bolus. I spoke with Dr. Shelle Iron with orthopedics who will operate on patient within the next few hours. He would like a repeat stat chemistry to re-check sodium which will be obtained. Patient admitted to internal medicine [facility] Dr. Elisabeth Pigeon, [facility] team 1.        Trevor Mace, [place] 02/25/13 1319

## 2013-02-26 ENCOUNTER — Encounter (HOSPITAL_COMMUNITY): Payer: Self-pay | Admitting: Specialist

## 2013-02-26 DIAGNOSIS — R410 Disorientation, unspecified: Secondary | ICD-10-CM

## 2013-02-26 DIAGNOSIS — D72829 Elevated white blood cell count, unspecified: Secondary | ICD-10-CM

## 2013-02-26 LAB — VITAMIN B12: Vitamin B-12: 352 pg/mL (ref 211–911)

## 2013-02-26 LAB — BASIC METABOLIC PANEL
BUN: 16 mg/dL (ref 6–23)
Calcium: 8.6 mg/dL (ref 8.4–10.5)
GFR calc Af Amer: >90 mL/min (ref >90)
GFR calc non Af Amer: >90 mL/min (ref >90)
Glucose, Bld: 108 mg/dL — ABNORMAL HIGH (ref 70–99)
Sodium: 127 mEq/L — ABNORMAL LOW (ref 135–145)

## 2013-02-26 LAB — CBC
Hemoglobin: 10.7 g/dL — ABNORMAL LOW (ref 13.0–17.0)
MCH: 36.5 pg — ABNORMAL HIGH (ref 26.0–34.0)
MCHC: 37.7 g/dL — ABNORMAL HIGH (ref 30.0–36.0)

## 2013-02-26 LAB — RPR: RPR Ser Ql: NONREACTIVE

## 2013-02-26 MED ORDER — SODIUM CHLORIDE 0.9 % IV SOLN
INTRAVENOUS | Status: DC
  Administered 2013-02-26 – 2013-02-28 (×5): via INTRAVENOUS

## 2013-02-26 MED ORDER — VITAMIN B-1 100 MG PO TABS
100.0000 mg | ORAL_TABLET | Freq: Every day | ORAL | Status: DC
  Administered 2013-02-26 – 2013-03-01 (×4): 100 mg via ORAL
  Filled 2013-02-26 (×4): qty 1

## 2013-02-26 MED ORDER — FOLIC ACID 1 MG PO TABS
1.0000 mg | ORAL_TABLET | Freq: Every day | ORAL | Status: DC
  Administered 2013-02-26 – 2013-03-01 (×4): 1 mg via ORAL
  Filled 2013-02-26 (×4): qty 1

## 2013-02-26 NOTE — Evaluation (Signed)
Physical Therapy Evaluation Patient Details Name: Timothy Pena MRN: [id number] DOB: December 13, 1940 Today's Date: 02/26/2013 Time: 4098-1191 PT Time Calculation (min): 18 min  PT Assessment / Plan / Recommendation Clinical Impression  Pt s/p L IM nail.  Pt would benefit from acute PT services in order to improve independence with transfers and ambulation to prepare for d/c to next venue.  Pt previously independent and playing golf prior to admission.    PT Assessment       Follow Up Recommendations  Supervision/Assistance - 24 hour;SNF    Does the patient have the potential to tolerate intense rehabilitation      Barriers to Discharge        Equipment Recommendations  Rolling walker with 5" wheels    Recommendations for Other Services     Frequency Min 4X/week    Precautions / Restrictions Precautions Precautions: Fall Precaution Comments: WB LLE Restrictions Weight Bearing Restrictions: Yes LLE Weight Bearing: Partial weight bearing LLE Partial Weight Bearing Percentage or Pounds: 50% Other Position/Activity Restrictions: PWB LLE   Pertinent Vitals/Pain SaO2 89% room air upon sitting in recliner SaO2 97% with 1L reapplied Pt reports pain in L hip with mobility however declined pain meds.      Mobility  Bed Mobility Bed Mobility: Rolling Right;Supine to Sit Rolling Right: 3: Mod assist Supine to Sit: 3: Mod assist Details for Bed Mobility Assistance: verbal cues for safe technique Transfers Transfers: Stand to Sit;Sit to Stand;Stand Pivot Transfers Sit to Stand: 1: +2 Total assist;From bed;From elevated surface;With upper extremity assist Sit to Stand: Patient Percentage: 60% Stand to Sit: 1: +2 Total assist;To chair/3-in-1;With upper extremity assist;With armrests Stand to Sit: Patient Percentage: 60% Stand Pivot Transfers: 1: +2 Total assist Stand Pivot Transfers: Patient Percentage: 40% Details for Transfer Assistance: verbal cues for safety, technique, WB  status Ambulation/Gait Ambulation/Gait Assistance: Not tested (comment) (pt felt unable today)    Exercises     PT Diagnosis: Difficulty walking;Acute pain  PT Problem List: Decreased strength;Decreased activity tolerance;Decreased mobility;Decreased knowledge of precautions;Pain;Decreased knowledge of use of DME PT Treatment Interventions: DME instruction;Gait training;Functional mobility training;Therapeutic activities;Therapeutic exercise;Patient/family education   PT Goals Acute Rehab PT Goals PT Goal Formulation: With patient Time For Goal Achievement: 03/05/13 Potential to Achieve Goals: Good Pt will go Supine/Side to Sit: with supervision PT Goal: Supine/Side to Sit - Progress: Goal set today Pt will go Sit to Supine/Side: with supervision PT Goal: Sit to Supine/Side - Progress: Goal set today Pt will go Sit to Stand: with supervision PT Goal: Sit to Stand - Progress: Goal set today Pt will go Stand to Sit: with supervision PT Goal: Stand to Sit - Progress: Goal set today Pt will Ambulate: 51 - 150 feet;with supervision;with rolling walker PT Goal: Ambulate - Progress: Goal set today Pt will Perform Home Exercise Program: with supervision, verbal cues required/provided PT Goal: Perform Home Exercise Program - Progress: Goal set today  Visit Information  Last PT Received On: 02/26/13 Assistance Needed: +2    Subjective Data  Subjective: 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  Cognition  Cognition Overall Cognitive Status: Appears within functional limits for tasks assessed/performed Arousal/Alertness: Awake/alert Behavior During Session: [facility] for tasks performed    Extremity/Trunk Assessment Right Upper Extremity Assessment RUE ROM/Strength/Tone: [facility] for tasks assessed Left Upper Extremity Assessment LUE ROM/Strength/Tone: WFL for tasks assessed Right Lower Extremity Assessment RLE ROM/Strength/Tone: [facility] for tasks assessed Left Lower Extremity Assessment LLE ROM/Strength/Tone: Deficits LLE ROM/Strength/Tone Deficits: functional weakness observed, limited by pain    Balance    End of Session PT - End of Session Activity Tolerance: Patient limited by fatigue;Patient limited by pain Patient left: in chair;with call bell/phone within reach;with chair alarm set Nurse Communication: Mobility status;Weight bearing status  GP     Cedar Ditullio,KATHrine E 02/26/2013, 1:31 PM Zenovia Jarred, PT, DPT 02/26/2013 Pager: [phone]

## 2013-02-26 NOTE — Progress Notes (Signed)
Nutrition Brief Note  Patient identified on the Malnutrition Screening Tool (MST) Report  Body mass index is 21.22 kg/(m^2). Patient meets criteria for normal based on current BMI.  Pt reports that he usually weighs 141 lbs; pt weighed 139 lbs 02/25/13.   Current diet order is Full Liquids, patient is consuming approximately 100% of meals at this time. Pt reports having a good appetite and states he was eating well before his fall. Pt reports lactose sensitivity but, no other diet concerns. Pt reports following low salt diet and limiting red meat and fats at home.  Labs and medications reviewed. No nutrition interventions warranted at this time. If nutrition issues arise, please consult RD.   Ian Malkin RD, LDN Inpatient Clinical Dietitian Pager: [phone] After Hours Pager: [phone]

## 2013-02-26 NOTE — Op Note (Signed)
NAME:  PETR, BONTEMPO NO.:  [id number]  MEDICAL RECORD NO.:  [id number]  LOCATION:  1423                         FACILITY:  [facility]  PHYSICIAN:  Jene Every, M.D.    DATE OF BIRTH:  1941-08-14  DATE OF PROCEDURE:  02/25/2013 DATE OF DISCHARGE:                              OPERATIVE REPORT   PREOPERATIVE DIAGNOSIS:  Left comminuted intertrochanteric hip fracture.  POSTOPERATIVE DIAGNOSIS:  Left comminuted intertrochanteric hip fracture.  PROCEDURE PERFORMED:  Intramedullary nailing, left femur.  ANESTHESIA:  General.  ASSISTANT:  Ms. Lanna Poche, helpful in the patient's positioning and traction technique.  HISTORY:  A 72 year old, fell sustaining fractures, indicated for stabilization of the fracture.  Risk and benefits discussed including bleeding, infection, malunion, nonunion, need for revision, DVT, PE, anesthetic complications, etc.  TECHNIQUE:  With the patient in supine position, after induction of adequate anesthesia, 900 mg of clindamycin placed on the fracture table, all bony prominences were well padded.  Peroneal post well padded. Foley to gravity, well leg in gentle flexion and external rotation, left leg in longitudinal traction, reduced under x-ray in the AP and lateral plane.  The left hip peritrochanteric region was prepped draped in the usual sterile fashion.  Incision was made proximal to the trochanter. Subcutaneous tissue was dissected.  Electrocautery was used to achieve hemostasis.  The fascia lata identified, divided in line with skin incision.  The trochanter was identified with a guide pin, was advanced into the femoral canal, over reamed proximally and selected a 11 mm diameter nail, 130 degrees.  This with a short nail, [employer].  This was introduced into the canal without difficulty.  The external alignment guide was utilized.  We placed an incision into the lateral thigh for the leg screw.  We bluntly dissected down to  the outer aspect of the femur, placed a guide pin up and center to the femoral head and neck and verified satisfactorily in the AP and lateral plane.  Multiple images were taken.  Next, this was measured to a 90, reamed to 90, taking care not to push the guide pin further, in fact, the guide pin was removed after completion of the reaming.  I then placed 100 mm length lag screw and inserted with excellent purchase.  We then released traction and compressed the fracture site.  This was then locked proximally.  We then locked it distally in the static lock.  We made a small stab wound in the thigh and external alignment guide using the drill guide, drilled and placed a 38 length distal locking screw at the appropriate depth gauge measurement, excellent purchase was obtained.  We removed the external alignment guide in the AP and lateral plane, excellent reduction of the fracture and placement of the hardware without complications.  The wound was copiously irrigated.  Closed the fascia with 1 Vicryl, subcu with 2-0 Vicryl and all incisions on the skin with staples.  The wound was dressed sterilely, placed supine on hospital bed.  Leg lengths were equivalent, good pulses, good rotation, extubated without difficulty and transported to the recovery room in satisfactory condition.  The patient tolerated the procedure well with no complications.  Jene Every, M.D.     Cordelia Pen  D:  02/25/2013  T:  02/26/2013  Job:  [id number]

## 2013-02-26 NOTE — Progress Notes (Signed)
Subjective: 1 Day Post-Op Procedure(s) (LRB): INTRAMEDULLARY (IM) NAIL FEMORAL (Left) Patient reports pain as mild.  Hip pain much improved. No other c/o this AM.  Objective: Vital signs in last 24 hours: Temp:  [97.4 F (36.3 C)-100.3 F (37.9 C)] 98.4 F (36.9 C) (03/21 0615) Pulse Rate:  [57-102] 84 (03/21 0615) Resp:  [9-22] 17 (03/21 0615) BP: (106-175)/(60-122) 137/77 mmHg (03/21 0615) SpO2:  [72 %-100 %] 99 % (03/21 0615) Weight:  [63.3 kg (139 lb 8.8 oz)] 63.3 kg (139 lb 8.8 oz) (03/20 2012)  Intake/Output from previous day: 03/20 0701 - 03/21 0700 In: 1800 [I.V.:1700; IV Piggyback:100] Out: 2975 [Urine:2775; Blood:200] Intake/Output this shift:     Recent Labs  02/25/13 0940 02/25/13 1414 02/26/13 0455  HGB 13.4 11.2* 10.7*    Recent Labs  02/25/13 0940 02/25/13 1414 02/26/13 0455  WBC 14.7*  --  11.8*  RBC 3.57*  --  2.93*  HCT 34.1* 33.0* 28.4*  PLT 319  --  254    Recent Labs  02/25/13 0940 02/25/13 1414 02/26/13 0455  NA 117* 124* 127*  K 3.4* 3.8 3.9  CL 79* 92* 95*  CO2 22  --  26  BUN 41* 30* 16  CREATININE 1.38* 1.10 0.66  GLUCOSE 166* 108* 108*  CALCIUM 10.0  --  8.6    Recent Labs  02/25/13 1055  INR 0.92    Neurologically intact Neurovascular intact Sensation intact distally Intact pulses distally Dorsiflexion/Plantar flexion intact Incision: dressing C/D/I and no drainage No cellulitis present Compartment soft No calf pain or sign of DVT  Assessment/Plan: 1 Day Post-Op Procedure(s) (LRB): INTRAMEDULLARY (IM) NAIL FEMORAL (Left) Advance diet Up with therapy Hyponatremia and leukocytosis, improving PWB LLE Lovenox for DVT ppx Will discuss with Dr. Elissa Lovett, Dayna Barker. 02/26/2013, 7:36 AM

## 2013-02-26 NOTE — Clinical Social Work Psychosocial (Signed)
     Clinical Social Work Department BRIEF PSYCHOSOCIAL ASSESSMENT 02/26/2013  Patient:  Timothy Pena     Account Number:  [id number]     Admit date:  02/25/2013  Clinical Social Worker:  Orpah Greek  Date/Time:  02/26/2013 04:26 PM  Referred by:  Physician  Date Referred:  02/26/2013 Referred for  SNF Placement   Other Referral:   Interview type:  Patient Other interview type:    PSYCHOSOCIAL DATA Living Status:  WIFE Admitted from facility:   Level of care:   Primary support name:  Kevin Space (wife) ph#: [phone] Primary support relationship to patient:  SPOUSE Degree of support available:   good    CURRENT CONCERNS Current Concerns  Post-Acute Placement   Other Concerns:    SOCIAL WORK ASSESSMENT / PLAN CSW spoke with patient re: discharge planning. Note PT recommended SNF - patient is agreeable with plan.   Assessment/plan status:  Information/Referral to [facility] Other assessment/ plan:   Information/referral to community resources:   CSW completed FL2 and faxed information out to [facility] - provided bed offers.    PATIENTS/FAMILYS RESPONSE TO PLAN OF CARE: Patient is agreeable with plan for SNF - will review list and get back with CSW with SNF decision Monday morning.

## 2013-02-26 NOTE — Progress Notes (Signed)
[facility] PROGRESS NOTE  Timothy Pena WUJ:[id number] DOB: 1941/04/03 DOA: 02/25/2013 PCP: Astrid Divine, MD  Assessment/Plan: 1. Left Hip fracture: Patient S/P intramedullary nail femoral 3/20. PT per ortho.  2. Leukocytosis: Probably reactive. WBC trending down. Chest x ray negative. UA negative.  3. Hyponatremia: Improved with IV fluids. Continue with IV fluids.  4. Confusion/delirium ; Post operative although wife has notice some mild changes mentation over time. Timothy Pena probably has some baseline dementia. I will order TSH, B12, RPR.  5. Alcohol use: I will start Thiamine, folate. Monitor on CIWA protocol.  6. Hypertension; Holding lisinopril due to renal insufficiency.  7. Acute Kidney Failure: likely pre-renal. Improved with IV fluids.   Code Status: Full  Family Communication: Care discussed with patient and wife.  Disposition Plan: SNF at time of discharge.   Consultants:  Dr Jillyn Hidden  Procedures:  Left intramedullary nail femoral  Antibiotics:  Clindamycin 3 doses.   HPI/Subjective: At times confuse. No complaints. Timothy Pena drinks a beer and cup of wine every day. No history of DT.   Objective: Filed Vitals:   02/26/13 0201 02/26/13 0400 02/26/13 0615 02/26/13 0800  BP: 144/78  137/77   Pulse: 80  84   Temp: 97.4 F (36.3 C)  98.4 F (36.9 C)   TempSrc: Oral  Oral   Resp: 16 16 17 16   Height:      Weight:      SpO2: 99% 98% 99% 98%    Intake/Output Summary (Last 24 hours) at 02/26/13 1100 Last data filed at 02/26/13 0900  Gross per 24 hour  Intake   2960 ml  Output   2975 ml  Net    -15 ml   Filed Weights   02/25/13 2012  Weight: 63.3 kg (139 lb 8.8 oz)    Exam:   General:  No distress  Cardiovascular: S 1, S 2 RRR  Respiratory: CTA  Abdomen: BS present, soft, NT  Musculoskeletal: no edema.  Data Reviewed: Basic Metabolic Panel:  Recent Labs Lab 02/25/13 0940 02/25/13 1414 02/26/13 0455  NA 117* 124* 127*  K 3.4* 3.8 3.9   CL 79* 92* 95*  CO2 22  --  26  GLUCOSE 166* 108* 108*  BUN 41* 30* 16  CREATININE 1.38* 1.10 0.66  CALCIUM 10.0  --  8.6   Liver Function Tests:  Recent Labs Lab 02/25/13 0940  AST 38*  ALT 19  ALKPHOS 72  BILITOT 0.5  PROT 7.4  ALBUMIN 3.7   No results found for this basename: LIPASE, AMYLASE,  in the last 168 hours No results found for this basename: AMMONIA,  in the last 168 hours CBC:  Recent Labs Lab 02/25/13 0940 02/25/13 1414 02/26/13 0455  WBC 14.7*  --  11.8*  HGB 13.4 11.2* 10.7*  HCT 34.1* 33.0* 28.4*  MCV 95.5  --  96.9  PLT 319  --  254   Cardiac Enzymes: No results found for this basename: CKTOTAL, CKMB, CKMBINDEX, TROPONINI,  in the last 168 hours BNP (last 3 results) No results found for this basename: PROBNP,  in the last 8760 hours CBG: No results found for this basename: GLUCAP,  in the last 168 hours  No results found for this or any previous visit (from the past 240 hour(s)).   Studies: Dg Chest 2 View  02/25/2013  *RADIOLOGY REPORT*  Clinical Data: Larey Seat.  Altered mental status.  CHEST - 2 VIEW  Comparison: None  Findings: The cardiac silhouette, mediastinal and hilar contours  are normal.  There is mild tortuosity and calcification of the thoracic aorta.  The lungs are clear of acute process.  Rounded density in the right lower lung is likely a nipple shadow.  No pleural effusion.  The bony thorax is intact.  Degenerative changes noted in the thoracic spine.  IMPRESSION: No acute cardiopulmonary findings.   Original Report Authenticated By: Rudie Meyer, M.D.    Dg Hip Complete Left  02/25/2013  *RADIOLOGY REPORT*  Clinical Data: Larey Seat.  Left hip pain.  LEFT HIP - COMPLETE 2+ VIEW  Comparison: None  Findings: There is a mildly displaced comminuted intertrochanteric fracture of the left hip.  Mild displacement of the greater and lesser trochanters.  The right hip is normal.  The pubic symphysis and SI joints are intact.  IMPRESSION:  Intertrochanteric fracture left hip.   Original Report Authenticated By: Rudie Meyer, M.D.    Dg Hip Operative Left  02/25/2013  *RADIOLOGY REPORT*  Clinical Data: ORIF of the left hip for intertrochanteric hip fracture.  OPERATIVE LEFT HIP  Comparison: Left hip radiographs 02/25/2013  Findings: Four spot fluoroscopic intraoperative views of the left femur show postoperative changes of intramedullary nail placement in the left femur for intertrochanteric femur fracture.  A single distal locking screw is imaged.  No hardware complication or unexpected fracture is seen.  IMPRESSION: ORIF with an intramedullary nail for a left intertrochanteric femur fracture.  No complicating feature.   Original Report Authenticated By: Britta Mccreedy, M.D.    Ct Head Wo Contrast  02/25/2013  *RADIOLOGY REPORT*  Clinical Data: Fall.  Confusion.  Hip pain.  CT HEAD WITHOUT CONTRAST  Technique:  Contiguous axial images were obtained from the base of the skull through the vertex without contrast.  Comparison: None.  Findings: The brain stem, cerebellum, cerebral peduncles, thalami, basal ganglia, basilar cisterns, and ventricular system appear unremarkable.  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  No intracranial hemorrhage, mass lesion, or acute infarction is identified.  There is opacification of several posterior ethmoid air cells. Atherosclerotic calcification of the carotid siphons noted.  IMPRESSION:  1. 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 2.  Chronic ethmoid sinusitis. 3.  No acute intracranial findings.   Original Report Authenticated By: Gaylyn Rong, M.D.     Scheduled Meds: . enoxaparin (LOVENOX) injection  40 mg Subcutaneous Q24H   Continuous Infusions: . sodium chloride 1,000 mL (02/25/13 1247)  . 0.9 %  NaCl with KCl 20 mEq / L 75 mL/hr at 02/26/13 0004    Principal Problem:   Hip fracture, left Active Problems:   Leukocytosis   Hyponatremia   Hypokalemia   Acute kidney failure    Time spent: 25 minutes.    REGALADO,BELKYS  [facility] Pager [phone]. If 7PM-7AM, please contact night-coverage at [email or web address], password [employer] 02/26/2013, 11:00 AM  LOS: 1 day

## 2013-02-26 NOTE — Evaluation (Signed)
Occupational Therapy Evaluation Patient Details Name: Sung Parodi MRN: [id number] DOB: 11-07-1941 Today's Date: 02/26/2013    OT Assessment / Plan / Recommendation Clinical Impression  Pt presents to OT s/p fall in which he sustained a hip fracture. Pt with decreased I with ADL activity and will benefit from skilled OT to increase I with ADL activity and return to PLOF    OT Assessment  Patient needs continued OT Services    Follow Up Recommendations  SNF    Barriers to Discharge      Equipment Recommendations  None recommended by OT       Frequency  Min 2X/week    Precautions / Restrictions Precautions Precaution Comments: WB LLE Restrictions Weight Bearing Restrictions: Yes Other Position/Activity Restrictions: PWB LLE       ADL  Grooming: Performed;Wash/dry face Where Assessed - Grooming: Supported sitting Lower Body Dressing: Simulated;Maximal assistance Where Assessed - Lower Body Dressing: Supported sit to Pharmacist, hospital: Simulated;+2 Total assistance;Other (comment) (bed to chair) Toilet Transfer: Patient Percentage: 70% Toilet Transfer Method: Sit to stand Toileting - Clothing Manipulation and Hygiene: Simulated;+2 Total assistance Toileting - Architect and Hygiene: Patient Percentage: 70% Where Assessed - Toileting Clothing Manipulation and Hygiene: Standing    OT Diagnosis: Generalized weakness  OT Problem List: Decreased strength;Decreased knowledge of precautions OT Treatment Interventions: Self-care/ADL training;Patient/family education;DME and/or AE instruction   OT Goals Acute Rehab OT Goals OT Goal Formulation: With patient Time For Goal Achievement: 03/12/13 Potential to Achieve Goals: Good ADL Goals Pt Will Perform Grooming: with supervision;Standing at sink ADL Goal: Grooming - Progress: Goal set today Pt Will Perform Lower Body Dressing: with supervision;Sit to stand from bed;with adaptive equipment ADL Goal: Lower Body  Dressing - Progress: Goal set today Pt Will Transfer to Toilet: with supervision;Maintaining weight bearing status;Comfort height toilet ADL Goal: Toilet Transfer - Progress: Goal set today  Visit Information  Last OT Received On: 02/26/13 Assistance Needed: +2       Prior Functioning     Home Living Lives With: Alone Available Help at Discharge: Family Type of Home: House Home Access: Stairs to enter Secretary/administrator of Steps: 1 Entrance Stairs-Rails: None Home Layout: One level Bathroom Shower/Tub: Tub/shower unit;Walk-in shower Bathroom Toilet: Standard Home Adaptive Equipment: None Prior Function Level of Independence: Independent Driving: Yes Vocation: Retired Comments: VP at [employer] Communication: HOH         Vision/Perception Vision - History Patient Visual Report: No change from baseline   Cognition  Cognition Overall Cognitive Status: Appears within functional limits for tasks assessed/performed Arousal/Alertness: Awake/alert Behavior During Session: [facility] for tasks performed    Extremity/Trunk Assessment Right Upper Extremity Assessment RUE ROM/Strength/Tone: [facility] for tasks assessed Left Upper Extremity Assessment LUE ROM/Strength/Tone: WFL for tasks assessed     Mobility Bed Mobility Bed Mobility: Rolling Right;Supine to Sit Rolling Right: 3: Mod assist Supine to Sit: 3: Mod assist Details for Bed Mobility Assistance: verbal cues  Transfers Transfers: Sit to Stand;Stand to Sit Sit to Stand: 1: +2 Total assist Sit to Stand: Patient Percentage: 60% Stand to Sit: 1: +2 Total assist Stand to Sit: Patient Percentage: 60%           End of Session OT - End of Session Activity Tolerance: Patient tolerated treatment well Patient left: in chair;with call bell/phone within reach;with chair alarm set  GO     Jorie Zee, [place]ung 02/26/2013, 11:52 AM

## 2013-02-26 NOTE — Progress Notes (Signed)
   CARE MANAGEMENT NOTE 02/26/2013  Patient:  Timothy Pena, Timothy Pena   Account Number:  [id number]  Date Initiated:  02/26/2013  Documentation initiated by:  Jiles Crocker  Subjective/Objective Assessment:   ADMITTED WITH HIP FRACTURE     Action/Plan:   PCP: Astrid Divine, MD  LIVES ALONE; AWAITING ON PT/OT EVALS - ? SNF PLACEMENT; SOC WORKER REFERRAL PLACED   Anticipated DC Date:  03/02/2013   Anticipated DC Plan:  SKILLED NURSING FACILITY  In-house referral  Clinical Social Worker      DC Planning Services  CM consult         Status of service:  In process, will continue to follow Medicare Important Message given?  NA - LOS <3 / Initial given by admissions (If response is "NO", the following Medicare IM given date fields will be blank) Per UR Regulation:  Reviewed for med. necessity/level of care/duration of stay Comments:  02/26/2013- B Roberto Hlavaty RN,BSN,MHA

## 2013-02-26 NOTE — Clinical Social Work Placement (Signed)
     Clinical Social Work Department CLINICAL SOCIAL WORK PLACEMENT NOTE 02/26/2013  Patient:  Timothy Pena, Timothy Pena  Account Number:  [id number] Admit date:  02/25/2013  Clinical Social Worker:  Orpah Greek  Date/time:  02/26/2013 04:30 PM  Clinical Social Work is seeking post-discharge placement for this patient at the following level of care:   SKILLED NURSING   (*CSW will update this form in Epic as items are completed)   02/26/2013  Patient/family provided with [facility] Department of Clinical Social Works list of facilities offering this level of care within the geographic area requested by the patient (or if unable, by the patients family).  02/26/2013  Patient/family informed of their freedom to choose among providers that offer the needed level of care, that participate in Medicare, Medicaid or managed care program needed by the patient, have an available bed and are willing to accept the patient.  02/26/2013  Patient/family informed of [facility] ownership interest in [facility], as well as of the fact that they are under no obligation to receive care at this facility.  PASARR submitted to EDS on 02/26/2013 PASARR number received from EDS on 02/26/2013  FL2 transmitted to all facilities in geographic area requested by pt/family on  02/26/2013 FL2 transmitted to all facilities within larger geographic area on   Patient informed that his/her managed care company has contracts with or will negotiate with  certain facilities, including the following:     Patient/family informed of bed offers received:  02/26/2013 Patient chooses bed at  Physician recommends and patient chooses bed at    Patient to be transferred to  on   Patient to be transferred to facility by   The following physician request were entered in Epic:   Additional Comments:

## 2013-02-27 DIAGNOSIS — R404 Transient alteration of awareness: Secondary | ICD-10-CM

## 2013-02-27 LAB — CBC
MCHC: 36.5 g/dL — ABNORMAL HIGH (ref 30.0–36.0)
Platelets: 300 10*3/uL (ref 150–400)
RDW: 11.5 % (ref 11.5–15.5)

## 2013-02-27 LAB — BASIC METABOLIC PANEL
BUN: 9 mg/dL (ref 6–23)
Chloride: 93 mEq/L — ABNORMAL LOW (ref 96–112)
GFR calc Af Amer: >90 mL/min (ref >90)
GFR calc non Af Amer: >90 mL/min (ref >90)
Glucose, Bld: 95 mg/dL (ref 70–99)
Potassium: 3.8 mEq/L (ref 3.5–5.1)
Sodium: 127 mEq/L — ABNORMAL LOW (ref 135–145)

## 2013-02-27 LAB — TROPONIN I: Troponin I: <0.3 ng/mL (ref <0.30)

## 2013-02-27 MED ORDER — CYANOCOBALAMIN 1000 MCG/ML IJ SOLN
100.0000 ug | Freq: Once | INTRAMUSCULAR | Status: AC
Start: 2013-02-27 — End: 2013-02-27
  Administered 2013-02-27: 100 ug via INTRAMUSCULAR
  Filled 2013-02-27: qty 0.1

## 2013-02-27 MED ORDER — CYANOCOBALAMIN 1000 MCG/ML IJ SOLN
1000.0000 ug | Freq: Every day | INTRAMUSCULAR | Status: DC
  Administered 2013-02-28 – 2013-03-01 (×2): 1000 ug via INTRAMUSCULAR
  Filled 2013-02-27 (×2): qty 1

## 2013-02-27 MED ORDER — AMLODIPINE BESYLATE 5 MG PO TABS: 5.0000 mg | ORAL_TABLET | Freq: Every day | ORAL | Status: DC

## 2013-02-27 MED ORDER — METOPROLOL TARTRATE 25 MG PO TABS
25.0000 mg | ORAL_TABLET | Freq: Two times a day (BID) | ORAL | Status: DC
  Administered 2013-02-27 – 2013-03-01 (×5): 25 mg via ORAL
  Filled 2013-02-27 (×6): qty 1

## 2013-02-27 MED ORDER — MAGNESIUM OXIDE 400 (241.3 MG) MG PO TABS
200.0000 mg | ORAL_TABLET | Freq: Two times a day (BID) | ORAL | Status: DC
  Administered 2013-02-27 – 2013-03-01 (×5): 200 mg via ORAL
  Filled 2013-02-27 (×6): qty 0.5

## 2013-02-27 NOTE — Progress Notes (Signed)
[facility] PROGRESS NOTE  Timothy Pena ZOX:[id number] DOB: [birth year] DOA: 02/25/2013 PCP: Astrid Divine, MD  Assessment/Plan: 1. Left Hip fracture: Patient S/P intramedullary nail femoral 3/20. PT per ortho. SNF when stable. On Lovenox for DVT prophylaxis.  2. Leukocytosis: Probably reactive. WBC trending down. Chest x ray negative. UA negative.  3. Hyponatremia: Improved with IV fluids. Continue with IV fluids. Check urine osmolality.  4. Confusion/delirium ; Post operative although wife has notice some mild changes mentation over time. He probably has some baseline dementia.  TSH 0.4, B 12 352, RPR non reactive. Will give B 12 supplements. Will check MMA level.  5. Alcohol use: I will start Thiamine, folate. Monitor on CIWA protocol. No evidence of DT.  6. Hypertension; Holding lisinopril due to renal insufficiency. Start metoprolol.  7. Acute Kidney Failure: likely pre-renal. Improved with IV fluids.  8. SVT: started on metoprolol, cycle cardiac enzymes. Mg normal 1.9. Will give oral magnesium supplement.   Code Status: Full  Family Communication: Care discussed with patient and wife.  Disposition Plan: SNF at time of discharge.   Consultants:  Dr Jillyn Hidden  Procedures:  Left intramedullary nail femoral  Antibiotics:  Clindamycin 3 doses.   HPI/Subjective: Feeling well, oriented to place time and situation. No complain. He is willing to go SNF.   Objective: Filed Vitals:   02/27/13 0000 02/27/13 0400 02/27/13 0509 02/27/13 0800  BP:   174/60   Pulse:   91   Temp:   98.1 F (36.7 C)   TempSrc:   Oral   Resp: 16 16  16   Height:      Weight:      SpO2: 94% 94% 98% 94%    Intake/Output Summary (Last 24 hours) at 02/27/13 1250 Last data filed at 02/27/13 0655  Gross per 24 hour  Intake 2106.25 ml  Output   1875 ml  Net 231.25 ml   Filed Weights   02/25/13 2012  Weight: 63.3 kg (139 lb 8.8 oz)    Exam:   General:  No  distress  Cardiovascular: S 1, S 2 RRR  Respiratory: CTA  Abdomen: BS present, soft, NT  Musculoskeletal: no edema.  Data Reviewed: Basic Metabolic Panel:  Recent Labs Lab 02/25/13 0940 02/25/13 1414 02/26/13 0455 02/27/13 0537  NA 117* 124* 127* 127*  K 3.4* 3.8 3.9 3.8  CL 79* 92* 95* 93*  CO2 22  --  26 25  GLUCOSE 166* 108* 108* 95  BUN 41* 30* 16 9  CREATININE 1.38* 1.10 0.66 0.54  CALCIUM 10.0  --  8.6 8.8  MG  --   --   --  1.9   Liver Function Tests:  Recent Labs Lab 02/25/13 0940  AST 38*  ALT 19  ALKPHOS 72  BILITOT 0.5  PROT 7.4  ALBUMIN 3.7   No results found for this basename: LIPASE, AMYLASE,  in the last 168 hours No results found for this basename: AMMONIA,  in the last 168 hours CBC:  Recent Labs Lab 02/25/13 0940 02/25/13 1414 02/26/13 0455 02/27/13 0537  WBC 14.7*  --  11.8* 10.7*  HGB 13.4 11.2* 10.7* 9.9*  HCT 34.1* 33.0* 28.4* 27.1*  MCV 95.5  --  96.9 97.8  PLT 319  --  254 300   Cardiac Enzymes: No results found for this basename: CKTOTAL, CKMB, CKMBINDEX, TROPONINI,  in the last 168 hours BNP (last 3 results) No results found for this basename: PROBNP,  in the last 8760 hours CBG: No  results found for this basename: GLUCAP,  in the last 168 hours  No results found for this or any previous visit (from the past 240 hour(s)).   Studies: Dg Chest 2 View  02/25/2013  *RADIOLOGY REPORT*  Clinical Data: Larey Seat.  Altered mental status.  CHEST - 2 VIEW  Comparison: None  Findings: The cardiac silhouette, mediastinal and hilar contours are normal.  There is mild tortuosity and calcification of the thoracic aorta.  The lungs are clear of acute process.  Rounded density in the right lower lung is likely a nipple shadow.  No pleural effusion.  The bony thorax is intact.  Degenerative changes noted in the thoracic spine.  IMPRESSION: No acute cardiopulmonary findings.   Original Report Authenticated By: Rudie Meyer, M.D.    Dg Hip  Operative Left  02/25/2013  *RADIOLOGY REPORT*  Clinical Data: ORIF of the left hip for intertrochanteric hip fracture.  OPERATIVE LEFT HIP  Comparison: Left hip radiographs 02/25/2013  Findings: Four spot fluoroscopic intraoperative views of the left femur show postoperative changes of intramedullary nail placement in the left femur for intertrochanteric femur fracture.  A single distal locking screw is imaged.  No hardware complication or unexpected fracture is seen.  IMPRESSION: ORIF with an intramedullary nail for a left intertrochanteric femur fracture.  No complicating feature.   Original Report Authenticated By: Britta Mccreedy, M.D.     Scheduled Meds: . enoxaparin (LOVENOX) injection  40 mg Subcutaneous Q24H  . folic acid  1 mg Oral Daily  . metoprolol tartrate  25 mg Oral BID  . thiamine  100 mg Oral Daily   Continuous Infusions: . sodium chloride 100 mL/hr at 02/27/13 4782    Principal Problem:   Hip fracture, left Active Problems:   Leukocytosis   Hyponatremia   Hypokalemia   Acute kidney failure   Acute delirium    Time spent: 25 minutes.    Mita Vallo  [facility] Pager [phone]. If 7PM-7AM, please contact night-coverage at [email or web address], password [facility] 02/27/2013, 12:50 PM  LOS: 2 days

## 2013-02-27 NOTE — Progress Notes (Signed)
   Subjective: 2 Days Post-Op Procedure(s) (LRB): INTRAMEDULLARY (IM) NAIL FEMORAL (Left)   Patient reports pain as mild, pain well controlled. No events throughout the night. States that he is surprised on how well it feels after surgery. Little sore, but in general the pain is minimal. No events throughout the night.   Objective:   VITALS:   Filed Vitals:   02/27/13 0800  BP: 174/60  Pulse: 91  Temp: 98.1 F (36.7 C)   Resp: 16    Neurovascular intact Dorsiflexion/Plantar flexion intact Incision: dressing C/D/I No cellulitis present Compartment soft  LABS  Recent Labs  02/25/13 0940 02/25/13 1414 02/26/13 0455 02/27/13 0537  HGB 13.4 11.2* 10.7* 9.9*  HCT 34.1* 33.0* 28.4* 27.1*  WBC 14.7*  --  11.8* 10.7*  PLT 319  --  254 300     Recent Labs  02/25/13 1414 02/26/13 0455 02/27/13 0537  NA 124* 127* 127*  K 3.8 3.9 3.8  BUN 30* 16 9  CREATININE 1.10 0.66 0.54  GLUCOSE 108* 108* 95     Assessment/Plan: 2 Days Post-Op Procedure(s) (LRB): INTRAMEDULLARY (IM) NAIL FEMORAL (Left)   Up with therapy Orthopaedically stable PWB left leg.   Anastasio Auerbach Erich Kochan   PAC  02/27/2013, 10:28 AM

## 2013-02-27 NOTE — Progress Notes (Signed)
Pt had 9 and 10 beat run of SVT. Pt resting, VS 98.1, 174/80, 16, 91.  M. Burnadette Peter, NP notified.  No new orders at this time. Will continue to monitor. Newman N[place]

## 2013-02-27 NOTE — Progress Notes (Signed)
Physical Therapy Treatment Patient Details Name: Timothy Pena MRN: [id number] DOB: 1941/06/14 Today's Date: 02/27/2013 Time: 7829-5621 PT Time Calculation (min): 15 min  PT Assessment / Plan / Recommendation Comments on Treatment Session  Pt very confused during session and didn't seem to know what happened to his hip or what was going on with therapy.  Required max cues for attending to task.  Also required increased assist to complete tasks.     Follow Up Recommendations  Supervision/Assistance - 24 hour;SNF     Does the patient have the potential to tolerate intense rehabilitation     Barriers to Discharge        Equipment Recommendations  Rolling walker with 5" wheels    Recommendations for Other Services    Frequency Min 4X/week   Plan Discharge plan remains appropriate    Precautions / Restrictions Precautions Precautions: Fall Restrictions Weight Bearing Restrictions: Yes LLE Weight Bearing: Partial weight bearing LLE Partial Weight Bearing Percentage or Pounds: 50%   Pertinent Vitals/Pain Pt states hip hurts, but unable to give number.     Mobility  Bed Mobility Bed Mobility: Supine to Sit Supine to Sit: 1: +2 Total assist Supine to Sit: Patient Percentage: 40% Details for Bed Mobility Assistance: Assist for BLE and trunk to attain sitting position.  Max cues for hand placement and technique, however pt unable to fully follow commands and required assist to complete bed mobility.  Transfers Transfers: Sit to Stand;Stand to Sit Sit to Stand: 1: +2 Total assist;From elevated surface;With upper extremity assist;From bed Sit to Stand: Patient Percentage: 50% Stand to Sit: 1: +2 Total assist;With upper extremity assist;With armrests;To chair/3-in-1 Stand to Sit: Patient Percentage: 50% Details for Transfer Assistance: Assist to rise, steady and maintain upright stance.  Noted pt with posterior lean and required cues for hand placement and technique.    Ambulation/Gait Ambulation/Gait Assistance: 1: +2 Total assist Ambulation/Gait: Patient Percentage: 40% Ambulation Distance (Feet): 5 Feet Assistive device: Rolling walker Ambulation/Gait Assistance Details: MAX verbal, visual and manual cues for sequencing/technique with RW, maintaining upright stance and to continue with amb.  Pt was able take some steps, however stopped and would not continue and replied "I'm just not sure what we are doing."  Gait Pattern: Step-to pattern;Decreased stride length;Antalgic;Trunk flexed;Narrow base of support Gait velocity: decreased    Exercises     PT Diagnosis:    PT Problem List:   PT Treatment Interventions:     PT Goals Acute Rehab PT Goals PT Goal Formulation: With patient Time For Goal Achievement: 03/05/13 Potential to Achieve Goals: Good Pt will go Supine/Side to Sit: with supervision PT Goal: Supine/Side to Sit - Progress: Not progressing Pt will go Sit to Stand: with supervision PT Goal: Sit to Stand - Progress: Not progressing Pt will go Stand to Sit: with supervision PT Goal: Stand to Sit - Progress: Not progressing Pt will Ambulate: 51 - 150 feet;with supervision;with rolling walker PT Goal: Ambulate - Progress: Progressing toward goal  Visit Information  Last PT Received On: 02/27/13 Assistance Needed: +2    Subjective Data  Subjective: I don't really know what I'm doing.    Cognition  Cognition Overall Cognitive Status: Impaired Area of Impairment: Memory;Following commands;Awareness of errors;Awareness of deficits Arousal/Alertness: Lethargic Orientation Level: Disoriented to;Time;Situation Behavior During Session: Lethargic Memory: Decreased recall of precautions Following Commands: Follows one step commands inconsistently;Follows one step commands with increased time Awareness of Errors: Assistance required to identify errors made;Assistance required to correct errors made Cognition -  Other Comments: Pt very  confused during this session and stated "I'm not really sure what we are doing" after getting up to take some steps.     Balance     End of Session PT - End of Session Equipment Utilized During Treatment: Gait belt Activity Tolerance: Patient limited by fatigue;Patient limited by pain (confusion) Patient left: in chair;with call bell/phone within reach;with chair alarm set;with family/visitor present Nurse Communication: Mobility status;Other (comment) (confusion, condom cath fell off during mobility)   GP     [place]ista Deck 02/27/2013, 4:17 PM

## 2013-02-28 LAB — BASIC METABOLIC PANEL
CO2: 26 mEq/L (ref 19–32)
Calcium: 8.7 mg/dL (ref 8.4–10.5)
Creatinine, Ser: 0.55 mg/dL (ref 0.50–1.35)
GFR calc non Af Amer: >90 mL/min (ref >90)
Glucose, Bld: 105 mg/dL — ABNORMAL HIGH (ref 70–99)

## 2013-02-28 LAB — TROPONIN I: Troponin I: <0.3 ng/mL (ref <0.30)

## 2013-02-28 MED ORDER — POTASSIUM CHLORIDE CRYS ER 20 MEQ PO TBCR
40.0000 meq | EXTENDED_RELEASE_TABLET | Freq: Once | ORAL | Status: AC
  Administered 2013-02-28: 40 meq via ORAL
  Filled 2013-02-28: qty 2

## 2013-02-28 MED ORDER — LORAZEPAM 0.5 MG PO TABS
0.5000 mg | ORAL_TABLET | Freq: Once | ORAL | Status: AC
  Administered 2013-02-28: 0.5 mg via ORAL
  Filled 2013-02-28: qty 1

## 2013-02-28 MED ORDER — AMLODIPINE BESYLATE 5 MG PO TABS
5.0000 mg | ORAL_TABLET | Freq: Every day | ORAL | Status: DC
  Administered 2013-02-28 – 2013-03-01 (×2): 5 mg via ORAL
  Filled 2013-02-28 (×2): qty 1

## 2013-02-28 MED ORDER — SODIUM CHLORIDE 0.9 % IV SOLN
INTRAVENOUS | Status: DC
  Administered 2013-02-28: 13:00:00 via INTRAVENOUS

## 2013-02-28 NOTE — Progress Notes (Signed)
Timothy Pena  MRN: [id number] DOB/Age: [birth year] 72 y.o. Physician: Jacquelyne Balint Procedure: Procedure(s) (LRB): INTRAMEDULLARY (IM) NAIL FEMORAL (Left)     Subjective: Overall doing well, with really minimal pain  Vital Signs Temp:  [97.7 F (36.5 C)-98.9 F (37.2 C)] 97.8 F (36.6 C) (03/23 0427) Pulse Rate:  [74-89] 78 (03/23 0427) Resp:  [18-20] 18 (03/23 0742) BP: (133-190)/(64-89) 171/77 mmHg (03/23 0427) SpO2:  [94 %-98 %] 98 % (03/23 0742)  Lab Results  Recent Labs  02/26/13 0455 02/27/13 0537  WBC 11.8* 10.7*  HGB 10.7* 9.9*  HCT 28.4* 27.1*  PLT 254 300   BMET  Recent Labs  02/27/13 0537 02/28/13 0700  NA 127* 127*  K 3.8 3.3*  CL 93* 92*  CO2 25 26  GLUCOSE 95 105*  BUN 9 8  CREATININE 0.54 0.55  CALCIUM 8.8 8.7   INR  Date Value Range Status  02/25/2013 0.92  0.00 - 1.49 Final     Exam Left hip dressing dry, mild bruising Moves feet and ankles well        Plan To [facility] tomorrow for [facility] Continue current orders  [facility] for Dr.Kevin Supple 02/28/2013, 9:26 AM

## 2013-02-28 NOTE — Progress Notes (Signed)
[facility] PROGRESS NOTE  Timothy Pena WUJ:[id number] DOB: November 24, 1941 DOA: 02/25/2013 PCP: Astrid Divine, MD  Assessment/Plan: 1. Left Hip fracture: Patient S/P intramedullary nail femoral 3/20. PT per ortho. On Lovenox for DVT prophylaxis.  2. Leukocytosis: Probably reactive. WBC trending down. Chest x ray negative. UA negative.  3. Hyponatremia: Improved with IV fluids. Continue with IV fluids. Urine osmolality pending.  4. Confusion/delirium ; Post operative although wife has notice some mild changes mentation over time. He probably has some baseline dementia.  TSH 0.4, B 12 352, RPR non reactive. Will give B 12 supplements. MMA level pending.  5. Alcohol use: I will start Thiamine, folate. Monitor on CIWA protocol. No evidence of DT.  6. Hypertension; Holding lisinopril due to renal insufficiency. Start metoprolol. Will start Norvasc.  7. Acute Kidney Failure: likely pre-renal. Improved with IV fluids.  8. SVT: started on metoprolol, cycle cardiac enzymes. Mg normal 1.9. Will give oral magnesium supplement.   Code Status: Full  Family Communication: Care discussed with patient/ Disposition Plan: SNF in 24 hour if Lab work stable.    Consultants:  Dr Jillyn Hidden  Procedures:  Left intramedullary nail femoral  Antibiotics:  Clindamycin 3 doses.   HPI/Subjective: No complaints. Feeling well.   Objective: Filed Vitals:   02/27/13 1600 02/27/13 2218 02/28/13 0427 02/28/13 0742  BP:  190/89 171/77   Pulse:  89 78   Temp:  97.7 F (36.5 C) 97.8 F (36.6 C)   TempSrc:  Oral Oral   Resp: 20 18 18 18   Height:      Weight:      SpO2: 96% 98% 98% 98%    Intake/Output Summary (Last 24 hours) at 02/28/13 1219 Last data filed at 02/28/13 0830  Gross per 24 hour  Intake    840 ml  Output   2250 ml  Net  -1410 ml   Filed Weights   02/25/13 2012  Weight: 63.3 kg (139 lb 8.8 oz)    Exam:   General:  No distress  Cardiovascular: S 1, S 2  RRR  Respiratory: CTA  Abdomen: BS present, soft, NT  Musculoskeletal: no edema.  Data Reviewed: Basic Metabolic Panel:  Recent Labs Lab 02/25/13 0940 02/25/13 1414 02/26/13 0455 02/27/13 0537 02/28/13 0700  NA 117* 124* 127* 127* 127*  K 3.4* 3.8 3.9 3.8 3.3*  CL 79* 92* 95* 93* 92*  CO2 22  --  26 25 26   GLUCOSE 166* 108* 108* 95 105*  BUN 41* 30* 16 9 8   CREATININE 1.38* 1.10 0.66 0.54 0.55  CALCIUM 10.0  --  8.6 8.8 8.7  MG  --   --   --  1.9  --    Liver Function Tests:  Recent Labs Lab 02/25/13 0940  AST 38*  ALT 19  ALKPHOS 72  BILITOT 0.5  PROT 7.4  ALBUMIN 3.7   No results found for this basename: LIPASE, AMYLASE,  in the last 168 hours No results found for this basename: AMMONIA,  in the last 168 hours CBC:  Recent Labs Lab 02/25/13 0940 02/25/13 1414 02/26/13 0455 02/27/13 0537  WBC 14.7*  --  11.8* 10.7*  HGB 13.4 11.2* 10.7* 9.9*  HCT 34.1* 33.0* 28.4* 27.1*  MCV 95.5  --  96.9 97.8  PLT 319  --  254 300   Cardiac Enzymes:  Recent Labs Lab 02/27/13 1320 02/27/13 1840 02/28/13 0700  TROPONINI <0.30 <0.30 <0.30   BNP (last 3 results) No results found for this basename:  PROBNP,  in the last 8760 hours CBG: No results found for this basename: GLUCAP,  in the last 168 hours  No results found for this or any previous visit (from the past 240 hour(s)).   Studies: No results found.  Scheduled Meds: . cyanocobalamin  1,000 mcg Intramuscular Daily  . enoxaparin (LOVENOX) injection  40 mg Subcutaneous Q24H  . folic acid  1 mg Oral Daily  . magnesium oxide  200 mg Oral BID  . metoprolol tartrate  25 mg Oral BID  . potassium chloride  40 mEq Oral Once  . thiamine  100 mg Oral Daily   Continuous Infusions: . sodium chloride      Principal Problem:   Hip fracture, left Active Problems:   Leukocytosis   Hyponatremia   Hypokalemia   Acute kidney failure   Acute delirium    Time spent: 25  minutes.    Timothy Pena  [facility] Pager [phone]. If 7PM-7AM, please contact night-coverage at [email or web address], password [facility] 02/28/2013, 12:19 PM  LOS: 3 days

## 2013-03-01 LAB — BASIC METABOLIC PANEL
BUN: 10 mg/dL (ref 6–23)
CO2: 23 mEq/L (ref 19–32)
Calcium: 9 mg/dL (ref 8.4–10.5)
Chloride: 96 mEq/L (ref 96–112)
Creatinine, Ser: 0.52 mg/dL (ref 0.50–1.35)
Glucose, Bld: 116 mg/dL — ABNORMAL HIGH (ref 70–99)

## 2013-03-01 LAB — OSMOLALITY, URINE: Osmolality, Ur: 484 mOsm/kg (ref 390–1090)

## 2013-03-01 MED ORDER — METOPROLOL TARTRATE 25 MG PO TABS: 25.0000 mg | ORAL_TABLET | Freq: Two times a day (BID) | ORAL | Status: AC

## 2013-03-01 MED ORDER — POLYETHYLENE GLYCOL 3350 17 G PO PACK: 17.0000 g | PACK | Freq: Two times a day (BID) | ORAL | Status: DC

## 2013-03-01 MED ORDER — THIAMINE HCL 100 MG PO TABS: 100.0000 mg | ORAL_TABLET | Freq: Every day | ORAL | Status: DC

## 2013-03-01 MED ORDER — POTASSIUM CHLORIDE CRYS ER 20 MEQ PO TBCR
40.0000 meq | EXTENDED_RELEASE_TABLET | Freq: Once | ORAL | Status: AC
  Administered 2013-03-01: 40 meq via ORAL
  Filled 2013-03-01: qty 2

## 2013-03-01 MED ORDER — FOLIC ACID 1 MG PO TABS: 1.0000 mg | ORAL_TABLET | Freq: Every day | ORAL | Status: DC

## 2013-03-01 MED ORDER — AMLODIPINE BESYLATE 5 MG PO TABS: 5.0000 mg | ORAL_TABLET | Freq: Every day | ORAL | Status: AC

## 2013-03-01 MED ORDER — LORAZEPAM 0.5 MG PO TABS: 0.5000 mg | ORAL_TABLET | Freq: Every evening | ORAL | Status: DC | PRN

## 2013-03-01 MED ORDER — CYANOCOBALAMIN 1000 MCG/ML IJ SOLN: 1000.0000 ug | Freq: Every day | INTRAMUSCULAR | Status: DC

## 2013-03-01 NOTE — Progress Notes (Signed)
Subjective: 4 Days Post-Op Procedure(s) (LRB): INTRAMEDULLARY (IM) NAIL FEMORAL (Left) Patient reports pain as mild.    Objective: Vital signs in last 24 hours: Temp:  [97.4 F (36.3 C)-98.2 F (36.8 C)] 98.2 F (36.8 C) (03/24 0515) Pulse Rate:  [82-97] 96 (03/24 0515) Resp:  [12-20] 16 (03/24 0727) BP: (142-180)/(71-89) 155/71 mmHg (03/24 0515) SpO2:  [98 %-100 %] 98 % (03/24 0727)  Intake/Output from previous day: 03/23 0701 - 03/24 0700 In: 966.7 [P.O.:600; I.V.:366.7] Out: 2650 [Urine:2650] Intake/Output this shift:     Recent Labs  02/27/13 0537  HGB 9.9*    Recent Labs  02/27/13 0537  WBC 10.7*  RBC 2.77*  HCT 27.1*  PLT 300    Recent Labs  02/28/13 0700 03/01/13 0505  NA 127* 128*  K 3.3* 3.3*  CL 92* 96  CO2 26 23  BUN 8 10  CREATININE 0.55 0.52  GLUCOSE 105* 116*  CALCIUM 8.7 9.0   No results found for this basename: LABPT, INR,  in the last 72 hours  Neurologically intact Neurovascular intact Sensation intact distally Intact pulses distally Dorsiflexion/Plantar flexion intact Incision: dressing C/D/I and no drainage No cellulitis present Compartment soft  Assessment/Plan: 4 Days Post-Op Procedure(s) (LRB): INTRAMEDULLARY (IM) NAIL FEMORAL (Left) Up with therapy Discharge to SNF when medically stable per admitting service Lovenox for DVT ppx Follow up in 2 weeks with Dr. Shelle Iron for staple removal  BISSELL, Lockie Pares M. 03/01/2013, 7:59 AM

## 2013-03-01 NOTE — H&P (Signed)
[facility] called and given report. Pt to travel via [facility] to facility.

## 2013-03-01 NOTE — Progress Notes (Signed)
Patient is set to discharge to [facility] today. Patient & wife, [name] at bedside & aware. [facility] called for transport, [employer] Medicare has been obtained. Eber Jones from [facility] completed admission paperwork with wife at bedside. Discharge packet in [place].   Clinical Social Work Department CLINICAL SOCIAL WORK PLACEMENT NOTE 03/01/2013  Patient:  Timothy Pena, Timothy Pena  Account Number:  [id number] Admit date:  02/25/2013  Clinical Social Worker:  Orpah Greek  Date/time:  02/26/2013 04:30 PM  Clinical Social Work is seeking post-discharge placement for this patient at the following level of care:   SKILLED NURSING   (*CSW will update this form in Epic as items are completed)   02/26/2013  Patient/family provided with [facility] Department of Clinical Social Work's list of facilities offering this level of care within the geographic area requested by the patient (or if unable, by the patient's family).  02/26/2013  Patient/family informed of their freedom to choose among providers that offer the needed level of care, that participate in Medicare, Medicaid or managed care program needed by the patient, have an available bed and are willing to accept the patient.  02/26/2013  Patient/family informed of [facility]' ownership interest in [facility], as well as of the fact that they are under no obligation to receive care at this facility.  PASARR submitted to EDS on 02/26/2013 PASARR number received from EDS on 02/26/2013  FL2 transmitted to all facilities in geographic area requested by pt/family on  02/26/2013 FL2 transmitted to all facilities within larger geographic area on   Patient informed that his/her managed care company has contracts with or will negotiate with  certain facilities, including the following:     Patient/family informed of bed offers received:  02/26/2013 Patient chooses bed at [facility] Physician recommends and patient chooses bed at     Patient to be transferred to [facility] on  03/01/2013 Patient to be transferred to facility by [facility]  The following physician request were entered in Epic:   Additional Comments:  Unice Bailey, LCSW [facility] Clinical Social Worker cell #: [phone]

## 2013-03-01 NOTE — Progress Notes (Addendum)
Patient gets very confused and restless during HS. He pulls at equipment and tries to get out of bed frequently. Requested Ativan from on call MD so pt can get rest.    0356 Pt slept soundly after Ativan admin. Woke up trying to get oob again. Bed alarm placed on most sensitive setting. I will continue to monitor him.   0500 Patient tried to get OOB after pulling out IV line. I offered to place him in chair to appease his desire to get up. He was not able to stand even with 2 assisting him. Soft mittens placed on both hands for safety.

## 2013-03-01 NOTE — Discharge Summary (Addendum)
Physician Discharge Summary  Timothy Pena ZOX:[id number] DOB: 06-May-1941 DOA: 02/25/2013  PCP: Astrid Divine, MD  Admit date: 02/25/2013 Discharge date: 03/01/2013  Time spent: 35 minutes  Recommendations for Outpatient Follow-up:  1. Follow up with Dr Jillyn Hidden in 2 weeks.  2. Needs B-met to follow sodium level within 24 hour. 3. Need B-met to follow K level.   Discharge Diagnoses:    Hip fracture, left   Leukocytosis, reactive.   Hyponatremia    Hypokalemia   Acute kidney failure   Acute delirium   Discharge Condition: Stable  Diet recommendation: Regular diet  Filed Weights   02/25/13 2012  Weight: 63.3 kg (139 lb 8.8 oz)    History of present illness:  72 year old male with past medical history significant for hypertension who presented to [facility] ED 02/25/13 status post fall at home 3 days prior to this admission. Patient fell from a 4 foot ladder but has not sought medical care at that time. Patient was found by his significant other sitting on a couch. Patient was complaining of pain in the pelvic area. Patient reports no lightheadedness or loss of consciousness. No complaints of chest pain, shortness of breath or palpitations. No fever or chills. No abdominal pain or nausea or vomiting.  In ED, evaluation included x-ray of the left hip which revealed left intertrochanteric hip fracture. Orthopedic surgery was consulted by ED physician. Further evaluation included CBC which revealed leukocytosis with white blood cell count of 14.7. BMET revealed hyponatremia with sodium level of 117, potassium 3.4 and creatinine elevated at 1.38. There are no previous values for comparison.   Hospital Course:  1. Left Hip fracture: Patient S/P intramedullary nail femoral 3/20. PT per ortho. On Lovenox for DVT prophylaxis.  2. Leukocytosis: Probably reactive. WBC trending down. Chest x ray negative. UA negative.  3. Hyponatremia: Improved with IV fluids. urine osmolality 480. Multifactorial.  Dehydration, renal insufficiency. Component of SIADH, medications HCTZ. Sodium on admission at 117. Sodium remain stable at 128. Needs B-met to follow sodium level. Question if he has chronic hyponatremia.  4. Confusion/delirium ; Post operative although wife has notice some mild changes mentation over time. He probably has some baseline dementia. TSH 0.4, B 12 352, RPR non reactive. Will give B 12 supplements.  MMA level pending. His confusion is usually at night. Could consider ativan PRN.  5. Alcohol use: Continue with  Thiamine, folate. Monitor on CIWA protocol. No evidence of DT.  6. Hypertension; Holding lisinopril due to renal insufficiency. Continue with  Metoprolol and norvasc.  7. Acute Kidney Failure: likely pre-renal. Improved with IV fluids. BUN on admission at 41. Cr at 1.3 8. SVT: started on metoprolol,  cardiac enzymes negative. Mg normal 1.9.  Received  magnesium supplement.  9. Hypokalemia replaced. Received 40 meq times 1 on 3-24.   Procedures: Left intramedullary nail femoral 3-21   Consultations:  Dr Jillyn Hidden  Discharge Exam: Filed Vitals:   02/28/13 2313 03/01/13 0132 03/01/13 0515 03/01/13 0727  BP: 180/86 159/81 155/71   Pulse:   96   Temp:   98.2 F (36.8 C)   TempSrc:   Oral   Resp:   16 16  Height:      Weight:      SpO2:   98% 98%    General: no distress.  Cardiovascular: S 1, S 2 RRR Respiratory: CTA Abdomen: Soft, NT, ND Neuro: awake, oriented to person and time. No tremors.   Discharge Instructions  Discharge Orders   Future Orders  Complete By Expires     Diet general  As directed     Increase activity slowly  As directed         Medication List    STOP taking these medications       benazepril-hydrochlorthiazide 10-12.5 MG per tablet  Commonly known as:  LOTENSIN HCT     naproxen sodium 220 MG tablet  Commonly known as:  ANAPROX      TAKE these medications       amLODipine 5 MG tablet  Commonly known as:  NORVASC  Take 1 tablet  (5 mg total) by mouth daily.     cyanocobalamin 1000 MCG/ML injection  Commonly known as:  (VITAMIN B-12)  Inject 1 mL (1,000 mcg total) into the muscle daily.     folic acid 1 MG tablet  Commonly known as:  FOLVITE  Take 1 tablet (1 mg total) by mouth daily.     HYDROcodone-acetaminophen 5-325 MG per tablet  Commonly known as:  NORCO  Take 1-2 tablets by mouth every 6 (six) hours as needed for pain. MAXIMUM TOTAL ACETAMINOPHEN DOSE IS 4000 MG PER DAY     LORazepam 0.5 MG tablet  Commonly known as:  ATIVAN  Take 1 tablet (0.5 mg total) by mouth at bedtime as needed for anxiety.     LOVENOX 40 MG/0.4ML injection  Generic drug:  enoxaparin  Inject 0.4 mLs (40 mg total) into the skin daily.     metoprolol tartrate 25 MG tablet  Commonly known as:  LOPRESSOR  Take 1 tablet (25 mg total) by mouth 2 (two) times daily.     polyethylene glycol packet  Commonly known as:  MIRALAX  Take 17 g by mouth 2 (two) times daily.     thiamine 100 MG tablet  Take 1 tablet (100 mg total) by mouth daily.           Follow-up Information   Follow up with BEANE,JEFFREY C, MD In 2 weeks.   Contact information:   [address] [name] [place] [address] [phone]        The results of significant diagnostics from this hospitalization (including imaging, microbiology, ancillary and laboratory) are listed below for reference.    Significant Diagnostic Studies: Dg Chest 2 View  02/25/2013  *RADIOLOGY REPORT*  Clinical Data: Larey Seat.  Altered mental status.  CHEST - 2 VIEW  Comparison: None  Findings: The cardiac silhouette, mediastinal and hilar contours are normal.  There is mild tortuosity and calcification of the thoracic aorta.  The lungs are clear of acute process.  Rounded density in the right lower lung is likely a nipple shadow.  No pleural effusion.  The bony thorax is intact.  Degenerative changes noted in the thoracic spine.  IMPRESSION: No acute cardiopulmonary findings.    Original Report Authenticated By: Rudie Meyer, M.D.    Dg Hip Complete Left  02/25/2013  *RADIOLOGY REPORT*  Clinical Data: Larey Seat.  Left hip pain.  LEFT HIP - COMPLETE 2+ VIEW  Comparison: None  Findings: There is a mildly displaced comminuted intertrochanteric fracture of the left hip.  Mild displacement of the greater and lesser trochanters.  The right hip is normal.  The pubic symphysis and SI joints are intact.  IMPRESSION: Intertrochanteric fracture left hip.   Original Report Authenticated By: Rudie Meyer, M.D.    Dg Hip Operative Left  02/25/2013  *RADIOLOGY REPORT*  Clinical Data: ORIF of the left hip for intertrochanteric hip fracture.  OPERATIVE LEFT  HIP  Comparison: Left hip radiographs 02/25/2013  Findings: Four spot fluoroscopic intraoperative views of the left femur show postoperative changes of intramedullary nail placement in the left femur for intertrochanteric femur fracture.  A single distal locking screw is imaged.  No hardware complication or unexpected fracture is seen.  IMPRESSION: ORIF with an intramedullary nail for a left intertrochanteric femur fracture.  No complicating feature.   Original Report Authenticated By: Britta Mccreedy, M.D.    Ct Head Wo Contrast  02/25/2013  *RADIOLOGY REPORT*  Clinical Data: Fall.  Confusion.  Hip pain.  CT HEAD WITHOUT CONTRAST  Technique:  Contiguous axial images were obtained from the base of the skull through the vertex without contrast.  Comparison: None.  Findings: The brain stem, cerebellum, cerebral peduncles, thalami, basal ganglia, basilar cisterns, and ventricular system appear unremarkable.  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  No intracranial hemorrhage, mass lesion, or acute infarction is identified.  There is opacification of several posterior ethmoid air cells. Atherosclerotic calcification of the carotid siphons noted.  IMPRESSION:   1. 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 2.  Chronic ethmoid sinusitis. 3.  No acute intracranial findings.   Original Report Authenticated By: Gaylyn Rong, M.D.     Microbiology: No results found for this or any previous visit (from the past 240 hour(s)).   Labs: Basic Metabolic Panel:  Recent Labs Lab 02/25/13 0940 02/25/13 1414 02/26/13 0455 02/27/13 0537 02/28/13 0700 03/01/13 0505  NA 117* 124* 127* 127* 127* 128*  K 3.4* 3.8 3.9 3.8 3.3* 3.3*  CL 79* 92* 95* 93* 92* 96  CO2 22  --  26 25 26 23   GLUCOSE 166* 108* 108* 95 105* 116*  BUN 41* 30* 16 9 8 10   CREATININE 1.38* 1.10 0.66 0.54 0.55 0.52  CALCIUM 10.0  --  8.6 8.8 8.7 9.0  MG  --   --   --  1.9  --   --    Liver Function Tests:  Recent Labs Lab 02/25/13 0940  AST 38*  ALT 19  ALKPHOS 72  BILITOT 0.5  PROT 7.4  ALBUMIN 3.7   No results found for this basename: LIPASE, AMYLASE,  in the last 168 hours No results found for this basename: AMMONIA,  in the last 168 hours CBC:  Recent Labs Lab 02/25/13 0940 02/25/13 1414 02/26/13 0455 02/27/13 0537  WBC 14.7*  --  11.8* 10.7*  HGB 13.4 11.2* 10.7* 9.9*  HCT 34.1* 33.0* 28.4* 27.1*  MCV 95.5  --  96.9 97.8  PLT 319  --  254 300   Cardiac Enzymes:  Recent Labs Lab 02/27/13 1320 02/27/13 1840 02/28/13 0700  TROPONINI <0.30 <0.30 <0.30   BNP: BNP (last 3 results) No results found for this basename: PROBNP,  in the last 8760 hours CBG: No results found for this basename: GLUCAP,  in the last 168 hours     Signed:  Zerek Litsey  [facility] 03/01/2013, 10:06 AM

## 2013-03-02 ENCOUNTER — Non-Acute Institutional Stay (SKILLED_NURSING_FACILITY): Payer: Medicare Other | Admitting: Internal Medicine

## 2013-03-02 DIAGNOSIS — I471 Supraventricular tachycardia: Secondary | ICD-10-CM

## 2013-03-02 DIAGNOSIS — D7289 Other specified disorders of white blood cells: Secondary | ICD-10-CM

## 2013-03-02 DIAGNOSIS — E871 Hypo-osmolality and hyponatremia: Secondary | ICD-10-CM

## 2013-03-02 DIAGNOSIS — S7292XE Unspecified fracture of left femur, subsequent encounter for open fracture type I or II with routine healing: Secondary | ICD-10-CM

## 2013-03-02 DIAGNOSIS — D62 Acute posthemorrhagic anemia: Secondary | ICD-10-CM

## 2013-03-02 DIAGNOSIS — S7290XD Unspecified fracture of unspecified femur, subsequent encounter for closed fracture with routine healing: Secondary | ICD-10-CM

## 2013-03-02 LAB — METHYLMALONIC ACID, SERUM: Methylmalonic Acid, Quantitative: 0.36 umol/L (ref <0.40)

## 2013-03-15 NOTE — Progress Notes (Signed)
Patient ID: Timothy Pena, male   DOB: May 21, 1941, 72 y.o.   MRN: [id number]        HISTORY & PHYSICAL  DATE:   03/02/2013  FACILITY:  [facility]   LEVEL OF CARE: SNF   ALLERGIES:  Allergies  Allergen Reactions  . Penicillins Swelling    Throat swelling     CHIEF COMPLAINT:  Manage left hip fracture, hyponatremia and supraventricular tachycardia.    HISTORY OF PRESENT ILLNESS:  72 year-old, Caucasian male fell off a ladder and was having pain in the pelvic area.  In the emergency room, x-ray revealed left hip intertrochanteric fracture.  Subsequently, he underwent intramedullary nail fixation and tolerated the procedure well.  He is admitted to this facility for short-term rehabilitation.  He denies hip pain currently.    SUPRAVENTRICULAR TACHYCARDIA:  Patient was started on metoprolol.  Cardiac enzymes were negative, magnesium normal at 1.9.  He denies chest pain, shortness of breath or palpitations.    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  He received IV fluids.  Urine osmolality was 480.  At discharge, sodium was 128.  Wife at bedside reports still some confusion in the patient.     PAST MEDICAL HISTORY :  Past Medical History  Diagnosis Date  . Hypertension      PAST SURGICAL HISTORY: Past Surgical History  Procedure Laterality Date  . Cataract extraction, bilateral    . Dental implants    . Femur im nail Left 02/25/2013    Procedure: INTRAMEDULLARY (IM) NAIL FEMORAL;  Surgeon: Javier Docker, MD;  Location: [facility] ORS;  Service: Orthopedics;  Laterality: Left;     SOCIAL HISTORY:  reports that he has been smoking Cigarettes.  He has been smoking about 0.00 packs per day. He has never used smokeless tobacco. He reports that  drinks alcohol. He reports that he does not use illicit drugs.   FAMILY HISTORY:  None  CURRENT MEDICATIONS: Reviewed per MAR  REVIEW OF SYSTEMS:  See HPI otherwise 14 point ROS is negative.   PHYSICAL EXAMINATION  VS:  T  98.2      P 96      RR 16      BP  155/71     POX% 98       WT (Lb)  GENERAL: no acute distress, normal body habitus SKIN: warm & dry, no suspicious lesions or rashes, no excessive dryness EYES: conjunctivae normal, sclerae normal, normal eye lids MOUTH/THROAT: lips without lesions,no lesions in the mouth,tongue is without lesions,uvula elevates in midline NECK: supple, trachea midline, no neck masses, no thyroid tenderness, no thyromegaly LYMPHATICS: no LAN in the neck, no supraclavicular LAN RESPIRATORY: breathing is even & unlabored, BS CTAB CARDIAC: RRR, no murmur,no extra heart sounds, no edema GI:  ABDOMEN: abdomen soft, normal BS, no masses, no tenderness  LIVER/SPLEEN: no hepatomegaly, no splenomegaly MUSCULOSKELETAL: HEAD: normal to inspection & palpation BACK: no kyphosis, scoliosis or spinal processes tenderness EXTREMITIES: LEFT UPPER EXTREMITY: full range of motion, normal strength & tone RIGHT UPPER EXTREMITY:  full range of motion, normal strength & tone LEFT LOWER EXTREMITY:  Strength intact, range of motion not tested due to surgery. RIGHT LOWER EXTREMITY:  Strength intact, range of motion moderate PSYCHIATRIC: the patient is alert & oriented to person, affect & behavior appropriate  LABS/RADIOLOGY: Sodium 128, potassium 3.3, glucose 116,  otherwise BMP normal.   AST 38, otherwise liver profile normal.   White count 10.7, hemoglobin 9.9, MCV 97.8, platelets 300.    Troponin-I less than 0.03 x3.  Head CT:  No acute intracranial findings.    TSH 0.04.    Vitamin B12 level 352.    RPR nonreactive.    Urinalysis negative.    Chest x-ray:  Negative.     ASSESSMENT/PLAN:  Left hip intertrochanteric fracture.  Status post ORIF.  Continue rehabilitation.   Hyponatremia.  Reassess.    SVT.  Stable.     Leukocytosis 288.8.  Thought to be reactive.  We will reassess.    Acute blood loss anemia.  Reassess.   Hypertension.  Blood pressure elevated.  We will monitor.  We will review a BP log.   Acute renal failure.  Status post IV fluids.  Reassess.   Hypokalemia.  Repleted.  Reassess.    V58.69.  Check CBC and BMP.   I spoke with patient's wife at bedside and discussed his clinical status and the hyponatremia.    I have reviewed patient's medical records received from hospitalization.  CPT CODE: 59563.

## 2013-04-20 ENCOUNTER — Non-Acute Institutional Stay (SKILLED_NURSING_FACILITY): Payer: Medicare Other | Admitting: Internal Medicine

## 2013-04-20 DIAGNOSIS — K5900 Constipation, unspecified: Secondary | ICD-10-CM

## 2013-04-20 DIAGNOSIS — E538 Deficiency of other specified B group vitamins: Secondary | ICD-10-CM

## 2013-04-20 DIAGNOSIS — I10 Essential (primary) hypertension: Secondary | ICD-10-CM

## 2013-04-20 DIAGNOSIS — D7589 Other specified diseases of blood and blood-forming organs: Secondary | ICD-10-CM

## 2013-04-20 NOTE — Progress Notes (Signed)
PROGRESS NOTE  DATE: 04/20/2013  FACILITY: Nursing Home Location: [facility]  LEVEL OF CARE: SNF (31)  Routine Visit  CHIEF COMPLAINT:  Manage hypertension, constipation and vitamin B12 deficiency  HISTORY OF PRESENT ILLNESS:  REASSESSMENT OF ONGOING PROBLEM(S):  1. HTN: Pt 's HTN remains stable.  Denies CP, sob, DOE, pedal edema, headaches, dizziness or visual disturbances.  No complications from the medications currently being used.  Last BP : 103/ 63, 97/66, 121/73.  2. CONSTIPATION: The constipation remains stable. No complications from the medications presently being used. Patient denies ongoing constipation, abdominal pain, nausea or vomiting.  3. VITAMIN B12 DEFICIENCY: The B12 deficiency remains stable.  Patient denies paresthesias or gait & balance problems.  No complications reported from the B12 supplementation.  PAST MEDICAL HISTORY : Reviewed.  No changes.  CURRENT MEDICATIONS: Reviewed per [facility]  REVIEW OF SYSTEMS:  GENERAL: no change in appetite, no fatigue, no weight changes, no fever, chills or weakness RESPIRATORY: no cough, SOB, DOE, wheezing, hemoptysis CARDIAC: no chest pain, edema or palpitations GI: no abdominal pain, diarrhea, constipation, heart burn, nausea or vomiting  PHYSICAL EXAMINATION  VS:  T 96.4       P 78      RR 18      BP     POX % 98     WT (Lb)  GENERAL: no acute distress, thin body habitus EYES: conjunctivae normal, sclerae normal, normal eye lids NECK: supple, trachea midline, no neck masses, no thyroid tenderness, no thyromegaly LYMPHATICS: no LAN in the neck, no supraclavicular LAN RESPIRATORY: breathing is even & unlabored, BS CTAB CARDIAC: RRR, no murmur,no extra heart sounds, no edema GI: abdomen soft, normal BS, no masses, no tenderness, no hepatomegaly, no splenomegaly PSYCHIATRIC: the patient is alert & oriented to person, affect & behavior appropriate  LABS/RADIOLOGY:  4/14 hemoglobin 10.5, MCV  102.7 otherwise CBC normal 3/14 BMP normal  ASSESSMENT/PLAN:  1. vitamin B12 deficiency-continue supplementation. 2. constipation-well-controlled. 3. hypertension-well-controlled. 4. macrocytosis-check RBC folate and vitamin B12 level. 5. left hip fracture -status post ORIF. Patient is completing SNF rehabilitation. 6. acute blood loss anemia-stable. 7. check liver profile.  CPT CODE: 09811

## 2013-04-23 ENCOUNTER — Encounter: Payer: Self-pay | Admitting: Adult Health

## 2013-04-23 ENCOUNTER — Non-Acute Institutional Stay (SKILLED_NURSING_FACILITY): Payer: Medicare Other | Admitting: Adult Health

## 2013-04-23 DIAGNOSIS — S72002D Fracture of unspecified part of neck of left femur, subsequent encounter for closed fracture with routine healing: Secondary | ICD-10-CM

## 2013-04-23 DIAGNOSIS — I10 Essential (primary) hypertension: Secondary | ICD-10-CM

## 2013-04-23 DIAGNOSIS — K5900 Constipation, unspecified: Secondary | ICD-10-CM

## 2013-04-23 DIAGNOSIS — S72009D Fracture of unspecified part of neck of unspecified femur, subsequent encounter for closed fracture with routine healing: Secondary | ICD-10-CM

## 2013-04-23 NOTE — Progress Notes (Signed)
  Subjective:    Patient ID: Timothy Pena, male    DOB: 1941-04-14, 72 y.o.   MRN: [id number]  HPI This is a 72 year old male who is for discharge home with Home health PT, OT Nursing and Home health Aide. He has been admitted to [facility] on 03/01/13 from [facility] with left hip fracture S/P IM Nailing. He has been admitted for a short-term rehabilitation.   Review of Systems  Constitutional: Negative.   HENT: Negative.   Eyes: Negative.   Respiratory: Negative for shortness of breath.   Cardiovascular: Negative for leg swelling.  Gastrointestinal: Negative.   Endocrine: Negative.   Genitourinary: Negative.   Neurological: Negative.   Hematological: Negative for adenopathy. Does not bruise/bleed easily.  Psychiatric/Behavioral: Negative.        Objective:   Physical Exam  Constitutional: He is oriented to person, place, and time. He appears well-developed and well-nourished.  HENT:  Head: Normocephalic and atraumatic.  Right Ear: External ear normal.  Left Ear: External ear normal.  Eyes: Conjunctivae and EOM are normal. Pupils are equal, round, and reactive to light.  Neck: Normal range of motion. Neck supple. No thyromegaly present.  Cardiovascular: Normal rate, regular rhythm, normal heart sounds and intact distal pulses.   Pulmonary/Chest: Effort normal.  Abdominal: Soft. Bowel sounds are normal.  Musculoskeletal: Normal range of motion. He exhibits no edema and no tenderness.  Neurological: He is alert and oriented to person, place, and time.  Skin: Skin is warm and dry.  Psychiatric: He has a normal mood and affect. His behavior is normal. Judgment and thought content normal.   LABS: 4/14  Wbc 8.9  hgb 10.5  hct 30.7 3/14  Bmp nl except NA 130    Medications reviewed per [facility]     Assessment & Plan:   Unspecified constipation - no complaints of; continue Miralax  Essential hypertension, benign - well-controlled; continue Norvasc and  Lopressor  Hip fracture, left S/P IM Nailing - for Home health PT, OT, Nursing and Home health Aide follow-up

## 2013-05-12 ENCOUNTER — Encounter (INDEPENDENT_AMBULATORY_CARE_PROVIDER_SITE_OTHER): Payer: Medicare Other | Admitting: Ophthalmology

## 2013-05-12 DIAGNOSIS — H33009 Unspecified retinal detachment with retinal break, unspecified eye: Secondary | ICD-10-CM

## 2013-05-12 DIAGNOSIS — H35039 Hypertensive retinopathy, unspecified eye: Secondary | ICD-10-CM

## 2013-05-12 DIAGNOSIS — I10 Essential (primary) hypertension: Secondary | ICD-10-CM

## 2013-05-12 DIAGNOSIS — H43819 Vitreous degeneration, unspecified eye: Secondary | ICD-10-CM

## 2013-05-12 DIAGNOSIS — H348392 Tributary (branch) retinal vein occlusion, unspecified eye, stable: Secondary | ICD-10-CM

## 2013-05-12 NOTE — H&P (Signed)
Timothy Pena is an 72 y.o. male.   Chief Complaint:Loss of vision possibly 3 months ago left eye  HPI: Had a fall, went into rehab in March 2014.  Now notes lost vision and retinal detachment  Past Medical History  Diagnosis Date  . Hypertension     Past Surgical History  Procedure Laterality Date  . Cataract extraction, bilateral    . Dental implants    . Femur im nail Left 02/25/2013    Procedure: INTRAMEDULLARY (IM) NAIL FEMORAL;  Surgeon: Javier Docker, MD;  Location: [facility] ORS;  Service: Orthopedics;  Laterality: Left;    No family history on file. Social History:  reports that he has been smoking Cigarettes.  He has been smoking about 0.00 packs per day. He has never used smokeless tobacco. He reports that  drinks alcohol. He reports that he does not use illicit drugs.  Allergies:  Allergies  Allergen Reactions  . Penicillins Swelling    Throat swelling    No prescriptions prior to admission    Review of systems otherwise negative  There were no vitals taken for this visit.  Physical exam: Mental status: oriented x3. Eyes: See eye exam associated with this date of surgery in media tab.  Scanned in by scanning center Ears, Nose, Throat: within normal limits Neck: Within Normal limits General: within normal limits Chest: Within normal limits Breast: deferred Heart: Within normal limits Abdomen: Within normal limits GU: deferred Extremities: within normal limits Skin: within normal limits  Assessment/Plan Rhegmatogenous retinal detachment left eye Plan: To [facility] for Scleral buckle, possible vitrectomy, laser therapy, gas injection left eye  Sherrie George 05/12/2013, 4:03 PM

## 2013-05-13 ENCOUNTER — Encounter (HOSPITAL_COMMUNITY): Payer: Self-pay | Admitting: Pharmacy Technician

## 2013-05-24 ENCOUNTER — Encounter (HOSPITAL_COMMUNITY): Payer: Self-pay | Admitting: Ophthalmology

## 2013-05-25 ENCOUNTER — Encounter (HOSPITAL_COMMUNITY)
Admission: RE | Disposition: A | Discharge status: Home / Self Care | Payer: Self-pay | Source: Ambulatory Visit | Attending: Ophthalmology

## 2013-05-25 ENCOUNTER — Encounter (HOSPITAL_COMMUNITY): Payer: Self-pay | Admitting: Certified Registered"

## 2013-05-25 ENCOUNTER — Ambulatory Visit (HOSPITAL_COMMUNITY)
Admission: RE | Admit: 2013-05-25 | Discharge: 2013-05-26 | Disposition: A | Discharge status: Home / Self Care | Payer: Medicare Other | Source: Ambulatory Visit | Attending: Ophthalmology | Admitting: Ophthalmology

## 2013-05-25 ENCOUNTER — Ambulatory Visit (HOSPITAL_COMMUNITY): Payer: Medicare Other | Admitting: Certified Registered"

## 2013-05-25 ENCOUNTER — Ambulatory Visit (HOSPITAL_COMMUNITY): Payer: Medicare Other

## 2013-05-25 ENCOUNTER — Encounter (HOSPITAL_COMMUNITY): Payer: Self-pay | Admitting: *Deleted

## 2013-05-25 DIAGNOSIS — I10 Essential (primary) hypertension: Secondary | ICD-10-CM | POA: Insufficient documentation

## 2013-05-25 DIAGNOSIS — S3720XA Unspecified injury of bladder, initial encounter: Secondary | ICD-10-CM | POA: Insufficient documentation

## 2013-05-25 DIAGNOSIS — Y846 Urinary catheterization as the cause of abnormal reaction of the patient, or of later complication, without mention of misadventure at the time of the procedure: Secondary | ICD-10-CM | POA: Insufficient documentation

## 2013-05-25 DIAGNOSIS — H33002 Unspecified retinal detachment with retinal break, left eye: Secondary | ICD-10-CM

## 2013-05-25 DIAGNOSIS — H33009 Unspecified retinal detachment with retinal break, unspecified eye: Secondary | ICD-10-CM | POA: Insufficient documentation

## 2013-05-25 DIAGNOSIS — R319 Hematuria, unspecified: Secondary | ICD-10-CM | POA: Insufficient documentation

## 2013-05-25 DIAGNOSIS — S3730XA Unspecified injury of urethra, initial encounter: Secondary | ICD-10-CM | POA: Insufficient documentation

## 2013-05-25 HISTORY — DX: Other seasonal allergic rhinitis: J30.2

## 2013-05-25 HISTORY — PX: GAS INSERTION: SHX5336

## 2013-05-25 HISTORY — PX: PHOTOCOAGULATION WITH LASER: SHX6027

## 2013-05-25 HISTORY — PX: RETINAL DETACHMENT REPAIR W/ SCLERAL BUCKLE LE: SHX2338

## 2013-05-25 HISTORY — PX: SCLERAL BUCKLE: SHX5340

## 2013-05-25 HISTORY — DX: Anxiety disorder, unspecified: F41.9

## 2013-05-25 LAB — CBC
MCH: 34.3 pg — ABNORMAL HIGH (ref 26.0–34.0)
MCHC: 34.6 g/dL (ref 30.0–36.0)
Platelets: 346 10*3/uL (ref 150–400)
RBC: 3.91 MIL/uL — ABNORMAL LOW (ref 4.22–5.81)
RDW: 12.3 % (ref 11.5–15.5)

## 2013-05-25 LAB — COMPREHENSIVE METABOLIC PANEL
AST: 12 U/L (ref 0–37)
BUN: 10 mg/dL (ref 6–23)
CO2: 28 mEq/L (ref 19–32)
Chloride: 103 mEq/L (ref 96–112)
Creatinine, Ser: 0.77 mg/dL (ref 0.50–1.35)
GFR calc non Af Amer: 89 mL/min — ABNORMAL LOW (ref >90)
Total Bilirubin: 0.4 mg/dL (ref 0.3–1.2)

## 2013-05-25 LAB — GLUCOSE, CAPILLARY: Glucose-Capillary: 150 mg/dL — ABNORMAL HIGH (ref 70–99)

## 2013-05-25 SURGERY — SCLERAL BUCKLE
Anesthesia: General | Site: Eye | Laterality: Left | Wound class: Clean

## 2013-05-25 MED ORDER — TRIAMCINOLONE ACETONIDE 40 MG/ML IJ SUSP
INTRAMUSCULAR | Status: AC
  Filled 2013-05-25: qty 5

## 2013-05-25 MED ORDER — FOLIC ACID 1 MG PO TABS
1.0000 mg | ORAL_TABLET | Freq: Every day | ORAL | Status: DC
  Administered 2013-05-25: 1 mg via ORAL
  Filled 2013-05-25 (×2): qty 1

## 2013-05-25 MED ORDER — METOPROLOL TARTRATE 25 MG PO TABS
25.0000 mg | ORAL_TABLET | Freq: Two times a day (BID) | ORAL | Status: DC
  Administered 2013-05-25: 25 mg via ORAL
  Filled 2013-05-25 (×3): qty 1

## 2013-05-25 MED ORDER — PHENYLEPHRINE HCL 2.5 % OP SOLN: 1.0000 [drp] | OPHTHALMIC | Status: DC | PRN

## 2013-05-25 MED ORDER — MAGNESIUM HYDROXIDE 400 MG/5ML PO SUSP: 15.0000 mL | Freq: Four times a day (QID) | ORAL | Status: DC | PRN

## 2013-05-25 MED ORDER — ACETAZOLAMIDE SODIUM 500 MG IJ SOLR
500.0000 mg | Freq: Once | INTRAMUSCULAR | Status: AC
  Administered 2013-05-26: 500 mg via INTRAVENOUS
  Filled 2013-05-25: qty 500

## 2013-05-25 MED ORDER — HYDROCODONE-ACETAMINOPHEN 5-325 MG PO TABS
1.0000 | ORAL_TABLET | ORAL | Status: DC | PRN
  Administered 2013-05-25: 2 via ORAL
  Filled 2013-05-25: qty 2

## 2013-05-25 MED ORDER — PHENYLEPHRINE HCL 2.5 % OP SOLN
OPHTHALMIC | Status: AC
  Administered 2013-05-25: 1 [drp] via OPHTHALMIC
  Filled 2013-05-25: qty 2

## 2013-05-25 MED ORDER — BRIMONIDINE TARTRATE 0.2 % OP SOLN
1.0000 [drp] | Freq: Two times a day (BID) | OPHTHALMIC | Status: DC
  Filled 2013-05-25: qty 5

## 2013-05-25 MED ORDER — LIDOCAINE HCL 2 % IJ SOLN
INTRAMUSCULAR | Status: AC
  Filled 2013-05-25: qty 20

## 2013-05-25 MED ORDER — DEXAMETHASONE SODIUM PHOSPHATE 10 MG/ML IJ SOLN
INTRAMUSCULAR | Status: DC | PRN
  Administered 2013-05-25: 10 mg

## 2013-05-25 MED ORDER — DEXAMETHASONE SODIUM PHOSPHATE 10 MG/ML IJ SOLN
INTRAMUSCULAR | Status: AC
  Filled 2013-05-25: qty 1

## 2013-05-25 MED ORDER — NEOSTIGMINE METHYLSULFATE 1 MG/ML IJ SOLN
INTRAMUSCULAR | Status: DC | PRN
  Administered 2013-05-25: 5 mg via INTRAVENOUS

## 2013-05-25 MED ORDER — POLYMYXIN B SULFATE 500000 UNITS IJ SOLR
INTRAMUSCULAR | Status: AC
  Filled 2013-05-25: qty 1

## 2013-05-25 MED ORDER — PHENYLEPHRINE HCL 2.5 % OP SOLN
1.0000 [drp] | OPHTHALMIC | Status: AC | PRN
  Administered 2013-05-25 (×2): 1 [drp] via OPHTHALMIC

## 2013-05-25 MED ORDER — GATIFLOXACIN 0.5 % OP SOLN: 1.0000 [drp] | OPHTHALMIC | Status: DC | PRN

## 2013-05-25 MED ORDER — ONDANSETRON HCL 4 MG/2ML IJ SOLN
INTRAMUSCULAR | Status: DC | PRN
  Administered 2013-05-25: 4 mg via INTRAVENOUS

## 2013-05-25 MED ORDER — ROCURONIUM BROMIDE 100 MG/10ML IV SOLN
INTRAVENOUS | Status: DC | PRN
  Administered 2013-05-25: 50 mg via INTRAVENOUS

## 2013-05-25 MED ORDER — LIDOCAINE HCL 4 % MT SOLN
OROMUCOSAL | Status: DC | PRN
  Administered 2013-05-25: 4 mL via TOPICAL

## 2013-05-25 MED ORDER — BSS IO SOLN
INTRAOCULAR | Status: AC
  Filled 2013-05-25: qty 15

## 2013-05-25 MED ORDER — MORPHINE SULFATE 2 MG/ML IJ SOLN: 1.0000 mg | INTRAMUSCULAR | Status: DC | PRN

## 2013-05-25 MED ORDER — EPINEPHRINE HCL 1 MG/ML IJ SOLN
INTRAMUSCULAR | Status: AC
  Filled 2013-05-25: qty 1

## 2013-05-25 MED ORDER — MINERAL OIL LIGHT 100 % EX OIL
TOPICAL_OIL | CUTANEOUS | Status: AC
  Filled 2013-05-25: qty 25

## 2013-05-25 MED ORDER — PROPOFOL 10 MG/ML IV BOLUS
INTRAVENOUS | Status: DC | PRN
  Administered 2013-05-25: 140 mg via INTRAVENOUS
  Administered 2013-05-25: 20 mg via INTRAVENOUS

## 2013-05-25 MED ORDER — LACTATED RINGERS IV SOLN: INTRAVENOUS | Status: DC | PRN

## 2013-05-25 MED ORDER — GENTAMICIN SULFATE 40 MG/ML IJ SOLN
INTRAMUSCULAR | Status: AC
  Filled 2013-05-25: qty 2

## 2013-05-25 MED ORDER — DOCUSATE SODIUM 100 MG PO CAPS
100.0000 mg | ORAL_CAPSULE | Freq: Two times a day (BID) | ORAL | Status: DC
  Administered 2013-05-25: 100 mg via ORAL
  Filled 2013-05-25: qty 1

## 2013-05-25 MED ORDER — EPHEDRINE SULFATE 50 MG/ML IJ SOLN
INTRAMUSCULAR | Status: DC | PRN
  Administered 2013-05-25 (×2): 10 mg via INTRAVENOUS
  Administered 2013-05-25: 5 mg via INTRAVENOUS

## 2013-05-25 MED ORDER — GATIFLOXACIN 0.5 % OP SOLN
1.0000 [drp] | Freq: Four times a day (QID) | OPHTHALMIC | Status: DC
  Filled 2013-05-25: qty 2.5

## 2013-05-25 MED ORDER — BUPIVACAINE HCL (PF) 0.75 % IJ SOLN
INTRAMUSCULAR | Status: AC
  Filled 2013-05-25: qty 10

## 2013-05-25 MED ORDER — SODIUM CHLORIDE 0.9 % IJ SOLN
INTRAMUSCULAR | Status: DC | PRN
  Administered 2013-05-25: 12:00:00

## 2013-05-25 MED ORDER — ACETAMINOPHEN 325 MG PO TABS
325.0000 mg | ORAL_TABLET | ORAL | Status: DC | PRN
  Administered 2013-05-25: 650 mg via ORAL
  Filled 2013-05-25 (×2): qty 2

## 2013-05-25 MED ORDER — CYCLOPENTOLATE HCL 1 % OP SOLN: 1.0000 [drp] | OPHTHALMIC | Status: DC | PRN

## 2013-05-25 MED ORDER — HEMOSTATIC AGENTS (NO CHARGE) OPTIME
TOPICAL | Status: DC | PRN
  Administered 2013-05-25: 1 via TOPICAL

## 2013-05-25 MED ORDER — SODIUM HYALURONATE 10 MG/ML IO SOLN
INTRAOCULAR | Status: AC
  Filled 2013-05-25: qty 0.85

## 2013-05-25 MED ORDER — GATIFLOXACIN 0.5 % OP SOLN
1.0000 [drp] | OPHTHALMIC | Status: AC | PRN
  Administered 2013-05-25 (×3): 1 [drp] via OPHTHALMIC
  Filled 2013-05-25: qty 2.5

## 2013-05-25 MED ORDER — SODIUM CHLORIDE 0.9 % IV SOLN
INTRAVENOUS | Status: DC
  Administered 2013-05-25: 45 mL/h via INTRAVENOUS
  Administered 2013-05-25: 12:00:00 via INTRAVENOUS

## 2013-05-25 MED ORDER — GLYCOPYRROLATE 0.2 MG/ML IJ SOLN
INTRAMUSCULAR | Status: DC | PRN
  Administered 2013-05-25: 0.6 mg via INTRAVENOUS

## 2013-05-25 MED ORDER — BUPIVACAINE HCL (PF) 0.75 % IJ SOLN
INTRAMUSCULAR | Status: DC | PRN
  Administered 2013-05-25: 10 mL

## 2013-05-25 MED ORDER — HYPROMELLOSE (GONIOSCOPIC) 2.5 % OP SOLN
OPHTHALMIC | Status: AC
  Filled 2013-05-25: qty 15

## 2013-05-25 MED ORDER — CLINDAMYCIN PHOSPHATE 600 MG/50ML IV SOLN
INTRAVENOUS | Status: AC
  Administered 2013-05-25: 600 mg via INTRAVENOUS
  Filled 2013-05-25: qty 50

## 2013-05-25 MED ORDER — ATROPINE SULFATE 1 % OP SOLN
OPHTHALMIC | Status: AC
  Filled 2013-05-25: qty 2

## 2013-05-25 MED ORDER — TROPICAMIDE 1 % OP SOLN
1.0000 [drp] | OPHTHALMIC | Status: AC | PRN
  Administered 2013-05-25 (×3): 1 [drp] via OPHTHALMIC
  Filled 2013-05-25: qty 3

## 2013-05-25 MED ORDER — FENTANYL CITRATE 0.05 MG/ML IJ SOLN: 25.0000 ug | INTRAMUSCULAR | Status: DC | PRN

## 2013-05-25 MED ORDER — AMLODIPINE BESYLATE 5 MG PO TABS
5.0000 mg | ORAL_TABLET | Freq: Every day | ORAL | Status: DC
  Filled 2013-05-25: qty 1

## 2013-05-25 MED ORDER — TROPICAMIDE 1 % OP SOLN: 1.0000 [drp] | OPHTHALMIC | Status: DC | PRN

## 2013-05-25 MED ORDER — CYCLOPENTOLATE HCL 1 % OP SOLN
1.0000 [drp] | OPHTHALMIC | Status: AC | PRN
  Administered 2013-05-25 (×3): 1 [drp] via OPHTHALMIC
  Filled 2013-05-25: qty 2

## 2013-05-25 MED ORDER — BACITRACIN-POLYMYXIN B 500-10000 UNIT/GM OP OINT
1.0000 "application " | TOPICAL_OINTMENT | Freq: Four times a day (QID) | OPHTHALMIC | Status: DC
  Filled 2013-05-25: qty 3.5

## 2013-05-25 MED ORDER — BSS IO SOLN
INTRAOCULAR | Status: DC | PRN
  Administered 2013-05-25: 15 mL via INTRAOCULAR

## 2013-05-25 MED ORDER — TETRACAINE HCL 0.5 % OP SOLN
2.0000 [drp] | Freq: Once | OPHTHALMIC | Status: DC
  Filled 2013-05-25: qty 2

## 2013-05-25 MED ORDER — LATANOPROST 0.005 % OP SOLN
1.0000 [drp] | Freq: Every day | OPHTHALMIC | Status: DC
  Filled 2013-05-25: qty 2.5

## 2013-05-25 MED ORDER — BACITRACIN-POLYMYXIN B 500-10000 UNIT/GM OP OINT
TOPICAL_OINTMENT | OPHTHALMIC | Status: AC
  Filled 2013-05-25: qty 3.5

## 2013-05-25 MED ORDER — BACITRACIN-POLYMYXIN B 500-10000 UNIT/GM OP OINT
TOPICAL_OINTMENT | OPHTHALMIC | Status: DC | PRN
  Administered 2013-05-25: 1 via OPHTHALMIC

## 2013-05-25 MED ORDER — PREDNISOLONE ACETATE 1 % OP SUSP
1.0000 [drp] | Freq: Four times a day (QID) | OPHTHALMIC | Status: DC
  Filled 2013-05-25: qty 1
  Filled 2013-05-25: qty 5

## 2013-05-25 MED ORDER — TEMAZEPAM 15 MG PO CAPS: 15.0000 mg | ORAL_CAPSULE | Freq: Every evening | ORAL | Status: DC | PRN

## 2013-05-25 MED ORDER — LACTATED RINGERS IV SOLN
INTRAVENOUS | Status: DC | PRN
  Administered 2013-05-25: 12:00:00 via INTRAVENOUS

## 2013-05-25 MED ORDER — FENTANYL CITRATE 0.05 MG/ML IJ SOLN
INTRAMUSCULAR | Status: DC | PRN
  Administered 2013-05-25 (×2): 50 ug via INTRAVENOUS
  Administered 2013-05-25: 100 ug via INTRAVENOUS
  Administered 2013-05-25: 50 ug via INTRAVENOUS

## 2013-05-25 MED ORDER — ARTIFICIAL TEARS OP OINT
TOPICAL_OINTMENT | OPHTHALMIC | Status: DC | PRN
  Administered 2013-05-25: 1 via OPHTHALMIC

## 2013-05-25 MED ORDER — ACETAZOLAMIDE SODIUM 500 MG IJ SOLR
INTRAMUSCULAR | Status: AC
  Filled 2013-05-25: qty 500

## 2013-05-25 MED ORDER — SODIUM CHLORIDE 0.45 % IV SOLN
INTRAVENOUS | Status: DC
  Administered 2013-05-25: 19:00:00 via INTRAVENOUS

## 2013-05-25 MED ORDER — ONDANSETRON HCL 4 MG/2ML IJ SOLN: 4.0000 mg | Freq: Four times a day (QID) | INTRAMUSCULAR | Status: DC | PRN

## 2013-05-25 MED ORDER — LIDOCAINE HCL (CARDIAC) 20 MG/ML IV SOLN
INTRAVENOUS | Status: DC | PRN
  Administered 2013-05-25: 60 mg via INTRAVENOUS

## 2013-05-25 SURGICAL SUPPLY — 74 items
APPLICATOR DR MATTHEWS STRL (MISCELLANEOUS) ×16 IMPLANT
BLADE EYE CATARACT 19 1.4 BEAV (BLADE) IMPLANT
BLADE MVR KNIFE 19G (BLADE) IMPLANT
BLADE SURG 15 STRL LF DISP TIS (BLADE) IMPLANT
BLADE SURG 15 STRL SS (BLADE)
CANNULA ANT CHAM MAIN (OPHTHALMIC RELATED) IMPLANT
CANNULA DUAL BORE 23G (CANNULA) IMPLANT
CORDS BIPOLAR (ELECTRODE) IMPLANT
COTTONBALL LRG STERILE PKG (GAUZE/BANDAGES/DRESSINGS) ×6 IMPLANT
COVER SURGICAL LIGHT HANDLE (MISCELLANEOUS) ×2 IMPLANT
DRAPE OPHTHALMIC 77X100 STRL (CUSTOM PROCEDURE TRAY) ×2 IMPLANT
ERASER HMR WETFIELD 23G BP (MISCELLANEOUS) IMPLANT
FILTER BLUE MILLIPORE (MISCELLANEOUS) ×4 IMPLANT
FILTER STRAW FLUID ASPIR (MISCELLANEOUS) IMPLANT
GAS OPHTHALMIC (MISCELLANEOUS) ×2 IMPLANT
GLOVE SS BIOGEL STRL SZ 6.5 (GLOVE) ×1 IMPLANT
GLOVE SS BIOGEL STRL SZ 7 (GLOVE) ×1 IMPLANT
GLOVE SUPERSENSE BIOGEL SZ 6.5 (GLOVE) ×1
GLOVE SUPERSENSE BIOGEL SZ 7 (GLOVE) ×1
GLOVE SURG 8.5 LATEX PF (GLOVE) ×2 IMPLANT
GLOVE SURG SS PI 6.5 STRL IVOR (GLOVE) ×4 IMPLANT
GOWN STRL NON-REIN LRG LVL3 (GOWN DISPOSABLE) ×8 IMPLANT
ILLUMINATOR CHOW PICK 25GA (MISCELLANEOUS) IMPLANT
IMPL SILICONE (Ophthalmic Related) ×1 IMPLANT
IMPLANT RETINOL SPONGE SILICON (Ophthalmic Related) ×2 IMPLANT
IMPLANT SILICONE (Ophthalmic Related) ×3 IMPLANT
KIT BASIN OR (CUSTOM PROCEDURE TRAY) ×2 IMPLANT
KIT PERFLUORON PROCEDURE 5ML (MISCELLANEOUS) IMPLANT
KIT ROOM TURNOVER OR (KITS) ×2 IMPLANT
KNIFE CRESCENT 1.75 EDGEAHEAD (BLADE) IMPLANT
KNIFE GRIESHABER SHARP 2.5MM (MISCELLANEOUS) ×8 IMPLANT
MARKER SKIN DUAL TIP RULER LAB (MISCELLANEOUS) IMPLANT
MASK EYE SHIELD (GAUZE/BANDAGES/DRESSINGS) ×2 IMPLANT
NEEDLE 18GX1X1/2 (RX/OR ONLY) (NEEDLE) ×2 IMPLANT
NEEDLE 25GX 5/8IN NON SAFETY (NEEDLE) IMPLANT
NEEDLE 27GAX1X1/2 (NEEDLE) IMPLANT
NEEDLE HYPO 30X.5 LL (NEEDLE) ×4 IMPLANT
NS IRRIG 1000ML POUR BTL (IV SOLUTION) ×2 IMPLANT
PACK VITRECTOMY CUSTOM (CUSTOM PROCEDURE TRAY) ×2 IMPLANT
PAD ARMBOARD 7.5X6 YLW CONV (MISCELLANEOUS) ×4 IMPLANT
PAD EYE OVAL STERILE LF (GAUZE/BANDAGES/DRESSINGS) ×2 IMPLANT
PAK VITRECTOMY PIK 25 GA (OPHTHALMIC RELATED) IMPLANT
PROBE DIRECTIONAL LASER (MISCELLANEOUS) IMPLANT
REPL STRA BRUSH NEEDLE (NEEDLE) IMPLANT
RESERVOIR BACK FLUSH (MISCELLANEOUS) IMPLANT
ROLLS DENTAL (MISCELLANEOUS) ×4 IMPLANT
SET FLUID INJECTOR (SET/KITS/TRAYS/PACK) IMPLANT
SET VGFI TUBING 8065808002 (SET/KITS/TRAYS/PACK) IMPLANT
SLEEVE SCLERAL TYPE 270 (Ophthalmic Related) ×2 IMPLANT
SPEAR EYE SURG WECK-CEL (MISCELLANEOUS) ×4 IMPLANT
SPONGE SURGIFOAM ABS GEL 12-7 (HEMOSTASIS) ×2 IMPLANT
STOPCOCK 4 WAY LG BORE MALE ST (IV SETS) IMPLANT
SUT CHROMIC 7 0 TG140 8 (SUTURE) ×2 IMPLANT
SUT ETHILON 9 0 TG140 8 (SUTURE) IMPLANT
SUT MERSILENE 4 0 RV 2 (SUTURE) ×4 IMPLANT
SUT SILK 2 0 (SUTURE) ×1
SUT SILK 2-0 18XBRD TIE 12 (SUTURE) ×1 IMPLANT
SUT SILK 4 0 RB 1 (SUTURE) ×2 IMPLANT
SUT VICRYL 7 0 TG140 8 (SUTURE) IMPLANT
SYR 20CC LL (SYRINGE) ×2 IMPLANT
SYR 50ML LL SCALE MARK (SYRINGE) IMPLANT
SYR 5ML LL (SYRINGE) ×2 IMPLANT
SYR BULB 3OZ (MISCELLANEOUS) ×2 IMPLANT
SYR TB 1ML LUER SLIP (SYRINGE) IMPLANT
SYRINGE 10CC LL (SYRINGE) ×2 IMPLANT
TAPE SURG TRANSPORE 1 IN (GAUZE/BANDAGES/DRESSINGS) ×1 IMPLANT
TAPE SURGICAL TRANSPORE 1 IN (GAUZE/BANDAGES/DRESSINGS) ×1
TIRE RTNL 2.5XGRV CNCV 9X (Ophthalmic Related) ×1 IMPLANT
TOWEL OR 17X24 6PK STRL BLUE (TOWEL DISPOSABLE) IMPLANT
TOWEL OR 17X26 10 PK STRL BLUE (TOWEL DISPOSABLE) ×2 IMPLANT
TUBING ART PRESS 12 MALE/MALE (MISCELLANEOUS) IMPLANT
VITREORETINAL VISCODISSEC (MISCELLANEOUS) IMPLANT
WATER STERILE IRR 1000ML POUR (IV SOLUTION) ×2 IMPLANT
WIPE INSTRUMENT VISIWIPE 73X73 (MISCELLANEOUS) ×2 IMPLANT

## 2013-05-25 NOTE — Anesthesia Preprocedure Evaluation (Addendum)
Anesthesia Evaluation  Patient identified by MRN, date of birth, ID band Patient awake    Reviewed: Allergy & Precautions, H&P , NPO status , Patient's Chart, lab work & pertinent test results, reviewed documented beta blocker date and time   Airway Mallampati: I TM Distance: >3 FB Neck ROM: Full    Dental  (+) Edentulous Upper, Edentulous Lower and Dental Advisory Given   Pulmonary Current Smoker,  1/4 ppdx44yrs breath sounds clear to auscultation        Cardiovascular hypertension, Rhythm:Regular Rate:Normal     Neuro/Psych Anxiety    GI/Hepatic   Endo/Other    Renal/GU Renal disease     Musculoskeletal   Abdominal   Peds  Hematology   Anesthesia Other Findings   Reproductive/Obstetrics                         Anesthesia Physical Anesthesia Plan  ASA: III  Anesthesia Plan: General   Post-op Pain Management:    Induction: Intravenous  Airway Management Planned: Oral ETT  Additional Equipment:   Intra-op Plan:   Post-operative Plan: Extubation in OR  Informed Consent: I have reviewed the patients History and Physical, chart, labs and discussed the procedure including the risks, benefits and alternatives for the proposed anesthesia with the patient or authorized representative who has indicated his/her understanding and acceptance.   Dental advisory given  Plan Discussed with: CRNA, Anesthesiologist and Surgeon  Anesthesia Plan Comments:         Anesthesia Quick Evaluation

## 2013-05-25 NOTE — Transfer of Care (Signed)
Immediate Anesthesia Transfer of Care Note  Patient: Timothy Pena  Procedure(s) Performed: Procedure(s) with comments: SCLERAL BUCKLE (Left) HEADSCOPE LASER (Left) INSERTION OF GAS (Left) - C3F8  Patient Location: PACU  Anesthesia Type:General  Level of Consciousness: awake, alert  and oriented  Airway & Oxygen Therapy: Patient Spontanous Breathing and Patient connected to nasal cannula oxygen  Post-op Assessment: Report given to PACU RN, Post -op Vital signs reviewed and stable and Patient moving all extremities X 4  Post vital signs: Reviewed and stable  Complications: No apparent anesthesia complications

## 2013-05-25 NOTE — H&P (Signed)
I examined the patient today and there is no change in the medical status 

## 2013-05-25 NOTE — Progress Notes (Signed)
Pt unable to void. Bladder scanned for >900 cc. In and out cathed for 700 cc. States relief.Timothy Pena 05/25/2013

## 2013-05-25 NOTE — Anesthesia Postprocedure Evaluation (Signed)
  Anesthesia Post-op Note  Patient: Timothy Pena  Procedure(s) Performed: Procedure(s) with comments: SCLERAL BUCKLE (Left) HEADSCOPE LASER (Left) INSERTION OF GAS (Left) - C3F8  Patient Location: PACU  Anesthesia Type:General  Level of Consciousness: awake  Airway and Oxygen Therapy: Patient Spontanous Breathing  Post-op Pain: mild  Post-op Assessment: Post-op Vital signs reviewed, Patient's Cardiovascular Status Stable, Respiratory Function Stable, Patent Airway, No signs of Nausea or vomiting and Pain level controlled  Post-op Vital Signs: stable  Complications: No apparent anesthesia complications

## 2013-05-25 NOTE — Brief Op Note (Signed)
05/25/2013  2:03 PM  PATIENT:  Gelene Mink Baldwin  72 y.o. male  PRE-OPERATIVE DIAGNOSIS:  Retinal Detachment Left Eye  POST-OPERATIVE DIAGNOSIS:  Retinal Detachment Left Eye  PROCEDURE:  Procedure(s) with comments: SCLERAL BUCKLE (Left) HEADSCOPE LASER (Left) INSERTION OF GAS (Left) - C3F8  SURGEON:  Surgeon(s) and Role:    * Sherrie George, MD - Primary  Brief Operative note   Preoperative diagnosis:  Pre-Op Diagnosis Codes:    * Retinal detachment with retinal defect, unspecified [361.00] Postoperative diagnosis  Post-Op Diagnosis Codes:    * Retinal detachment with retinal defect, unspecified [361.00]  Surgeon:  Sherrie George, MD...  Assistant:  Rosalie Doctor SA    Anesthesia: General  Specimen: none  Estimated blood loss:  1cc  Complications: none  Patient sent to PACU in good condition  Composed by Sherrie George MD  Dictation number: [phone]

## 2013-05-26 ENCOUNTER — Inpatient Hospital Stay (INDEPENDENT_AMBULATORY_CARE_PROVIDER_SITE_OTHER): Payer: Medicare Other | Admitting: Ophthalmology

## 2013-05-26 DIAGNOSIS — H33009 Unspecified retinal detachment with retinal break, unspecified eye: Secondary | ICD-10-CM

## 2013-05-26 MED ORDER — ACETAZOLAMIDE 250 MG PO TABS: 250.0000 mg | ORAL_TABLET | Freq: Four times a day (QID) | ORAL | Status: DC

## 2013-05-26 MED ORDER — BRIMONIDINE TARTRATE 0.2 % OP SOLN: 1.0000 [drp] | Freq: Two times a day (BID) | OPHTHALMIC | Status: DC

## 2013-05-26 MED ORDER — HYDROCODONE-ACETAMINOPHEN 5-325 MG PO TABS: 1.0000 | ORAL_TABLET | ORAL | Status: DC | PRN

## 2013-05-26 MED ORDER — GATIFLOXACIN 0.5 % OP SOLN: 1.0000 [drp] | Freq: Four times a day (QID) | OPHTHALMIC | Status: DC

## 2013-05-26 MED ORDER — DORZOLAMIDE HCL-TIMOLOL MAL 2-0.5 % OP SOLN
1.0000 [drp] | Freq: Two times a day (BID) | OPHTHALMIC | Status: DC
  Administered 2013-05-26: 1 [drp] via OPHTHALMIC
  Filled 2013-05-26: qty 10

## 2013-05-26 MED ORDER — ACETAZOLAMIDE 250 MG PO TABS
250.0000 mg | ORAL_TABLET | Freq: Once | ORAL | Status: AC
  Administered 2013-05-26: 250 mg via ORAL
  Filled 2013-05-26: qty 1

## 2013-05-26 MED ORDER — DORZOLAMIDE HCL-TIMOLOL MAL 2-0.5 % OP SOLN: 1.0000 [drp] | Freq: Two times a day (BID) | OPHTHALMIC | Status: DC

## 2013-05-26 MED ORDER — BACITRACIN-POLYMYXIN B 500-10000 UNIT/GM OP OINT: 1.0000 "application " | TOPICAL_OINTMENT | Freq: Four times a day (QID) | OPHTHALMIC | Status: DC

## 2013-05-26 MED ORDER — PREDNISOLONE ACETATE 1 % OP SUSP: 1.0000 [drp] | Freq: Four times a day (QID) | OPHTHALMIC | Status: DC

## 2013-05-26 MED ORDER — LATANOPROST 0.005 % OP SOLN: 1.0000 [drp] | Freq: Every day | OPHTHALMIC | Status: DC

## 2013-05-26 MED ORDER — ACETAZOLAMIDE 250 MG PO TABS
250.0000 mg | ORAL_TABLET | Freq: Three times a day (TID) | ORAL | Status: DC
  Administered 2013-05-26: 250 mg via ORAL
  Filled 2013-05-26 (×6): qty 1

## 2013-05-26 NOTE — Progress Notes (Signed)
Completed shift assessment. Pt A&O x 4. Skin intact. Redness to left scalera. Pt denied pain. Discussed discharge instructions with pt including f/u appts, how and when to call the MD, medications to take at home, prescriptions, and activity restrictions. Spouse at bedside. Pt and spouse verbalized understanding and denied any questions. Walked pt out to car and assisted pt into car.   Juliane Lack, RN

## 2013-05-26 NOTE — Op Note (Signed)
NAME:  Timothy Pena, Timothy Pena NO.:  [id number]  MEDICAL RECORD NO.:  [id number]  LOCATION:  6N11C                        FACILITY:  [facility]  PHYSICIAN:  Beulah Gandy. Ashley Royalty, M.D. DATE OF BIRTH:  20-Dec-1940  DATE OF PROCEDURE: DATE OF DISCHARGE:                              OPERATIVE REPORT   ADMISSION DIAGNOSIS:  Rhegmatogenous retinal detachment, left eye.  PROCEDURE:  Scleral buckle, left eye; retinal photocoagulation, left eye; gas fluid exchange, left eye.  SURGEON:  Beulah Gandy. Ashley Royalty, MD  ASSISTANT:  Rosalie Doctor, SA.  ANESTHESIA:  General.  DETAILS:  Usual prep and drape, 360 degree limbal peritomy, isolation of 4 rectus muscles on 2-0 silk.  Scleral dissection for 360 degrees to admit a #2 79 intrascleral implant, diathermy placed in the bed.  Two sutures per quadrant were placed in the scleral flaps for a total of 8 sutures, two 79 implant was placed around the globe with the joint at 7 o'clock.  240 band was placed around the eye with a 270 sleeve at 7 o'clock.  A 508G radial segment was placed beneath the breaks at 12:30. Perforation site chosen at 3 o'clock in the posterior aspect of the bed. A large amount of clear colorless subretinal fluid came forth.  The buckle was closed as the fluid egressed.  The radial element was placed C3F8 50% 1.2 mL was injected into the vitreous cavity to reinflate the globe.  The fluid eventually stopped.  The scleral flaps were closed. Indirect ophthalmoscopy showed the retina to be lying nicely on the scleral buckle with the retinal breaks well supported.  The indirect ophthalmoscope laser was moved into place 1561 burns were placed around the retinal periphery and around the retinal breaks, power was between 468-500 mW, 1000 microns each, 0.1 seconds each.  The buckle was adjusted and trimmed.  The band was adjusted and trimmed.  The sutures were knotted and the free ends removed.  The conjunctiva was reposited with 7-0  chromic suture.  Polymyxin and gentamicin were irrigated into [place] space.  Marcaine was injected around the globe for postop pain. Closing pressure was 15 with a Barraquer tonometer.  COMPLICATIONS:  None.  DURATION:  Two hours.  The patient was awakened and taken to recovery in satisfactory condition.     Beulah Gandy. Ashley Royalty, M.D.     JDM/MEDQ  D:  05/25/2013  T:  05/26/2013  Job:  [id number]

## 2013-05-26 NOTE — Progress Notes (Signed)
05/26/2013, 6:50 AM  Mental Status:  Awake, Alert, Oriented  Anterior segment: Cornea  Clear    Anterior Chamber Clear    Lens:    IOL,  Intra Ocular Pressure 41 mmHg with Tonopen  Vitreous: Clear 15%gas bubble   Retina:  Attached Good laser reaction   Impression: Excellent result Retina attached Patient has increased intraocular pressure.  I administered Alphagan and Xalatan to him at the bedside.  I ordered Cosopt and diamox 500 mg po now. For his follow up:  He will be seen in my office in two hours on discharge from hospital. I was called at 12 MN by a nurse.  He said that the patient had a foley catheterization during second shift and the patient now had bloody urine.  On checking this am  I discussed the urine with the patient.  He said that the foley catheterization process was painful and the person must not have had much experience in placing foleys.  The patient said that the catheter 'nicked' him and caused the bloody urine.  Mr. Dayn had a fresh sample of urine at the bedside which was clear.  He is to see a urologist if the blood reappears.  Final Diagnosis: Active Problems:   * No active hospital problems. *   Plan: start post operative eye drops.  Discharge to home.  Give post operative instructions  Sherrie George 05/26/2013, 6:50 AM

## 2013-05-26 NOTE — Progress Notes (Signed)
Pt was assisted to the bathroom to void, fresh blood was noticed on the toilet bowl after he urinated and continued to trickle down on the floor as he washed his hands on the nearby sink. He was assisted back to his bed and was given a urinal to use since he still complains of urgency. Pt sitting in an upright position on his bed as he tries to void thru the urinal. He stated he was cath earlier in the evening because he could not urinate by himself and thought that might be the cause of his hematuria. Dr. Ashley Royalty notified of the event; gave no additional order but to closely monitor for further bleeding and further evaluation will be done during the morning rounds.

## 2013-05-26 NOTE — Progress Notes (Signed)
Pt voided clear, straw-colored urine with no trace or evidence of blood around 3:30 am. Will continue to monitor.

## 2013-05-26 NOTE — Discharge Summary (Signed)
Discharge summary not needed on [facility] patients per medical records. 

## 2013-05-27 ENCOUNTER — Encounter (HOSPITAL_COMMUNITY): Payer: Self-pay | Admitting: Ophthalmology

## 2013-06-01 ENCOUNTER — Ambulatory Visit (INDEPENDENT_AMBULATORY_CARE_PROVIDER_SITE_OTHER): Payer: Medicare Other | Admitting: Ophthalmology

## 2013-06-01 DIAGNOSIS — H33009 Unspecified retinal detachment with retinal break, unspecified eye: Secondary | ICD-10-CM

## 2013-06-23 ENCOUNTER — Encounter (HOSPITAL_COMMUNITY): Payer: Self-pay | Admitting: Pharmacy Technician

## 2013-06-23 ENCOUNTER — Encounter (INDEPENDENT_AMBULATORY_CARE_PROVIDER_SITE_OTHER): Payer: Medicare Other | Admitting: Ophthalmology

## 2013-06-23 DIAGNOSIS — H33009 Unspecified retinal detachment with retinal break, unspecified eye: Secondary | ICD-10-CM

## 2013-06-23 NOTE — H&P (Signed)
Timothy Pena is an 72 y.o. male.   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 Hypertension   . Anxiety   . Seasonal allergies     Past Surgical History  Procedure Laterality Date  . Cataract extraction, bilateral Bilateral ~ 2012  . Dental implants    . Femur im nail Left 02/25/2013    Procedure: INTRAMEDULLARY (IM) NAIL FEMORAL;  Surgeon: Javier Docker, MD;  Location: [facility]S;  Service: Orthopedics;  Laterality: Left;  . Retinal detachment repair w/ scleral buckle le Left 05/25/2013  . Scleral buckle Left 05/25/2013    Procedure: SCLERAL BUCKLE;  Surgeon: Sherrie George, MD;  Location: [facility] OR;  Service: Ophthalmology;  Laterality: Left;  . Photocoagulation with laser Left 05/25/2013    Procedure: HEADSCOPE LASER;  Surgeon: Sherrie George, MD;  Location: [facility] OR;  Service: Ophthalmology;  Laterality: Left;  . Gas insertion Left 05/25/2013    Procedure: INSERTION OF GAS;  Surgeon: Sherrie George, MD;  Location: [facility] OR;  Service: Ophthalmology;  Laterality: Left;  C3F8    No family history on file. Social History:  reports that he has been smoking Cigarettes.  He has been smoking about 0.00 packs per day for the past 53 years. He has never used smokeless tobacco. He reports that he drinks about 1.8 ounces of alcohol per week. He reports that he does not use illicit drugs.  Allergies:  Allergies  Allergen Reactions  . Penicillins Swelling    Throat swelling    No prescriptions prior to admission    Review of systems otherwise negative  There were no vitals taken for this visit.  Physical exam: Mental status: oriented x3. Eyes: See eye exam associated with this date of surgery in media tab.  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  Left eye  Sherrie George 06/23/2013, 12:39 PM

## 2013-06-25 ENCOUNTER — Encounter (HOSPITAL_COMMUNITY): Payer: Self-pay | Admitting: Ophthalmology

## 2013-06-29 ENCOUNTER — Encounter (HOSPITAL_COMMUNITY): Payer: Self-pay | Admitting: Anesthesiology

## 2013-06-29 ENCOUNTER — Encounter (HOSPITAL_COMMUNITY)
Admission: RE | Disposition: A | Discharge status: Home / Self Care | Payer: Self-pay | Source: Ambulatory Visit | Attending: Ophthalmology

## 2013-06-29 ENCOUNTER — Ambulatory Visit (HOSPITAL_COMMUNITY)
Admission: RE | Admit: 2013-06-29 | Discharge: 2013-06-30 | Disposition: A | Discharge status: Home / Self Care | Payer: Medicare Other | Source: Ambulatory Visit | Attending: Ophthalmology | Admitting: Ophthalmology

## 2013-06-29 ENCOUNTER — Ambulatory Visit (HOSPITAL_COMMUNITY): Payer: Medicare Other | Admitting: Anesthesiology

## 2013-06-29 ENCOUNTER — Encounter (HOSPITAL_COMMUNITY): Payer: Self-pay | Admitting: *Deleted

## 2013-06-29 DIAGNOSIS — F411 Generalized anxiety disorder: Secondary | ICD-10-CM | POA: Insufficient documentation

## 2013-06-29 DIAGNOSIS — H3342 Traction detachment of retina, left eye: Secondary | ICD-10-CM

## 2013-06-29 DIAGNOSIS — H33009 Unspecified retinal detachment with retinal break, unspecified eye: Secondary | ICD-10-CM | POA: Insufficient documentation

## 2013-06-29 DIAGNOSIS — H33002 Unspecified retinal detachment with retinal break, left eye: Secondary | ICD-10-CM | POA: Diagnosis present

## 2013-06-29 DIAGNOSIS — I10 Essential (primary) hypertension: Secondary | ICD-10-CM | POA: Insufficient documentation

## 2013-06-29 DIAGNOSIS — H3520 Other non-diabetic proliferative retinopathy, unspecified eye: Secondary | ICD-10-CM | POA: Insufficient documentation

## 2013-06-29 DIAGNOSIS — F17200 Nicotine dependence, unspecified, uncomplicated: Secondary | ICD-10-CM | POA: Insufficient documentation

## 2013-06-29 HISTORY — DX: Adverse effect of unspecified anesthetic, initial encounter: T41.45XA

## 2013-06-29 HISTORY — PX: PARS PLANA REPAIR OF RETINAL DEATACHMENT: SHX2165

## 2013-06-29 HISTORY — DX: Other complications of anesthesia, initial encounter: T88.59XA

## 2013-06-29 HISTORY — DX: Unspecified osteoarthritis, unspecified site: M19.90

## 2013-06-29 HISTORY — PX: 25 GAUGE PARS PLANA VITRECTOMY WITH 20 GAUGE MVR PORT: SHX6041

## 2013-06-29 LAB — CBC
HCT: 37.2 % — ABNORMAL LOW (ref 39.0–52.0)
Hemoglobin: 12.9 g/dL — ABNORMAL LOW (ref 13.0–17.0)
MCV: 98.9 fL (ref 78.0–100.0)
RBC: 3.76 MIL/uL — ABNORMAL LOW (ref 4.22–5.81)
WBC: 11.2 10*3/uL — ABNORMAL HIGH (ref 4.0–10.5)

## 2013-06-29 LAB — BASIC METABOLIC PANEL
BUN: 14 mg/dL (ref 6–23)
CO2: 27 mEq/L (ref 19–32)
Chloride: 104 mEq/L (ref 96–112)
Creatinine, Ser: 0.73 mg/dL (ref 0.50–1.35)
GFR calc Af Amer: >90 mL/min (ref >90)
Glucose, Bld: 101 mg/dL — ABNORMAL HIGH (ref 70–99)
Potassium: 4.4 mEq/L (ref 3.5–5.1)

## 2013-06-29 SURGERY — 25 GAUGE PARS PLANA VITRECTOMY WITH 20 GAUGE MVR PORT
Anesthesia: General | Site: Eye | Laterality: Left | Wound class: Clean

## 2013-06-29 MED ORDER — SODIUM HYALURONATE 10 MG/ML IO SOLN
INTRAOCULAR | Status: AC
  Filled 2013-06-29: qty 0.85

## 2013-06-29 MED ORDER — DEXAMETHASONE SODIUM PHOSPHATE 10 MG/ML IJ SOLN
INTRAMUSCULAR | Status: AC
  Filled 2013-06-29: qty 1

## 2013-06-29 MED ORDER — LATANOPROST 0.005 % OP SOLN
1.0000 [drp] | Freq: Every day | OPHTHALMIC | Status: DC
  Administered 2013-06-30: 1 [drp] via OPHTHALMIC
  Filled 2013-06-29: qty 2.5

## 2013-06-29 MED ORDER — TRIAMCINOLONE ACETONIDE 40 MG/ML IJ SUSP
INTRAMUSCULAR | Status: AC
  Filled 2013-06-29: qty 5

## 2013-06-29 MED ORDER — CYCLOPENTOLATE HCL 1 % OP SOLN
1.0000 [drp] | OPHTHALMIC | Status: DC | PRN
  Administered 2013-06-29: 1 [drp] via OPHTHALMIC

## 2013-06-29 MED ORDER — TROPICAMIDE 1 % OP SOLN
1.0000 [drp] | OPHTHALMIC | Status: DC | PRN
  Administered 2013-06-29: 1 [drp] via OPHTHALMIC

## 2013-06-29 MED ORDER — LIDOCAINE HCL 2 % IJ SOLN
INTRAMUSCULAR | Status: AC
  Filled 2013-06-29: qty 20

## 2013-06-29 MED ORDER — CLINDAMYCIN PHOSPHATE 600 MG/50ML IV SOLN
600.0000 mg | INTRAVENOUS | Status: DC
  Filled 2013-06-29: qty 50

## 2013-06-29 MED ORDER — METOPROLOL TARTRATE 25 MG PO TABS
25.0000 mg | ORAL_TABLET | Freq: Two times a day (BID) | ORAL | Status: DC
  Administered 2013-06-29: 25 mg via ORAL
  Filled 2013-06-29 (×3): qty 1

## 2013-06-29 MED ORDER — GATIFLOXACIN 0.5 % OP SOLN
1.0000 [drp] | Freq: Four times a day (QID) | OPHTHALMIC | Status: DC
  Administered 2013-06-30: 1 [drp] via OPHTHALMIC
  Filled 2013-06-29: qty 2.5

## 2013-06-29 MED ORDER — TROPICAMIDE 1 % OP SOLN
1.0000 [drp] | OPHTHALMIC | Status: AC | PRN
  Administered 2013-06-29 (×2): 1 [drp] via OPHTHALMIC

## 2013-06-29 MED ORDER — PREDNISOLONE ACETATE 1 % OP SUSP
1.0000 [drp] | Freq: Four times a day (QID) | OPHTHALMIC | Status: DC
  Administered 2013-06-30: 1 [drp] via OPHTHALMIC
  Filled 2013-06-29: qty 1

## 2013-06-29 MED ORDER — BACITRACIN-POLYMYXIN B 500-10000 UNIT/GM OP OINT
1.0000 "application " | TOPICAL_OINTMENT | Freq: Four times a day (QID) | OPHTHALMIC | Status: DC
  Administered 2013-06-30: 1 via OPHTHALMIC
  Filled 2013-06-29: qty 3.5

## 2013-06-29 MED ORDER — DEXAMETHASONE SODIUM PHOSPHATE 10 MG/ML IJ SOLN
INTRAMUSCULAR | Status: DC | PRN
  Administered 2013-06-29: 10 mg

## 2013-06-29 MED ORDER — DORZOLAMIDE HCL-TIMOLOL MAL 2-0.5 % OP SOLN
1.0000 [drp] | Freq: Two times a day (BID) | OPHTHALMIC | Status: DC
  Filled 2013-06-29: qty 10

## 2013-06-29 MED ORDER — BACITRACIN-POLYMYXIN B 500-10000 UNIT/GM OP OINT
TOPICAL_OINTMENT | OPHTHALMIC | Status: DC | PRN
  Administered 2013-06-29: 1 via OPHTHALMIC

## 2013-06-29 MED ORDER — GENTAMICIN SULFATE 40 MG/ML IJ SOLN
INTRAMUSCULAR | Status: AC
  Filled 2013-06-29: qty 2

## 2013-06-29 MED ORDER — ONDANSETRON HCL 4 MG/2ML IJ SOLN
INTRAMUSCULAR | Status: DC | PRN
  Administered 2013-06-29: 4 mg via INTRAVENOUS

## 2013-06-29 MED ORDER — TEMAZEPAM 15 MG PO CAPS: 15.0000 mg | ORAL_CAPSULE | Freq: Every evening | ORAL | Status: DC | PRN

## 2013-06-29 MED ORDER — ESMOLOL HCL 10 MG/ML IV SOLN
INTRAVENOUS | Status: DC | PRN
  Administered 2013-06-29: 30 mg via INTRAVENOUS

## 2013-06-29 MED ORDER — CYCLOPENTOLATE HCL 1 % OP SOLN
1.0000 [drp] | OPHTHALMIC | Status: AC | PRN
  Administered 2013-06-29 (×2): 1 [drp] via OPHTHALMIC

## 2013-06-29 MED ORDER — HYALURONIDASE HUMAN 150 UNIT/ML IJ SOLN
INTRAMUSCULAR | Status: AC
  Filled 2013-06-29: qty 1

## 2013-06-29 MED ORDER — BRIMONIDINE TARTRATE 0.2 % OP SOLN
1.0000 [drp] | Freq: Two times a day (BID) | OPHTHALMIC | Status: DC
  Administered 2013-06-30: 1 [drp] via OPHTHALMIC
  Filled 2013-06-29: qty 5

## 2013-06-29 MED ORDER — CEFAZOLIN SODIUM-DEXTROSE 2-3 GM-% IV SOLR: 2.0000 g | INTRAVENOUS | Status: DC

## 2013-06-29 MED ORDER — ROCURONIUM BROMIDE 100 MG/10ML IV SOLN
INTRAVENOUS | Status: DC | PRN
  Administered 2013-06-29: 50 mg via INTRAVENOUS

## 2013-06-29 MED ORDER — ATROPINE SULFATE 1 % OP SOLN
OPHTHALMIC | Status: AC
  Filled 2013-06-29: qty 2

## 2013-06-29 MED ORDER — DOCUSATE SODIUM 100 MG PO CAPS
100.0000 mg | ORAL_CAPSULE | Freq: Two times a day (BID) | ORAL | Status: DC
  Administered 2013-06-29: 100 mg via ORAL
  Filled 2013-06-29: qty 1

## 2013-06-29 MED ORDER — 0.9 % SODIUM CHLORIDE (POUR BTL) OPTIME
TOPICAL | Status: DC | PRN
  Administered 2013-06-29: 200 mL

## 2013-06-29 MED ORDER — MIDAZOLAM HCL 5 MG/5ML IJ SOLN
INTRAMUSCULAR | Status: DC | PRN
  Administered 2013-06-29: 2 mg via INTRAVENOUS

## 2013-06-29 MED ORDER — GATIFLOXACIN 0.5 % OP SOLN
1.0000 [drp] | OPHTHALMIC | Status: DC | PRN
  Administered 2013-06-29: 1 [drp] via OPHTHALMIC

## 2013-06-29 MED ORDER — HYDRALAZINE HCL 20 MG/ML IJ SOLN
5.0000 mg | Freq: Once | INTRAMUSCULAR | Status: AC
  Administered 2013-06-29: 5 mg via INTRAVENOUS

## 2013-06-29 MED ORDER — LIDOCAINE HCL (CARDIAC) 20 MG/ML IV SOLN
INTRAVENOUS | Status: DC | PRN
  Administered 2013-06-29: 60 mg via INTRAVENOUS

## 2013-06-29 MED ORDER — BUPIVACAINE HCL (PF) 0.75 % IJ SOLN
INTRAMUSCULAR | Status: AC
  Filled 2013-06-29: qty 10

## 2013-06-29 MED ORDER — SODIUM CHLORIDE 0.9 % IV SOLN
INTRAVENOUS | Status: DC | PRN
  Administered 2013-06-29: 13:00:00 via INTRAVENOUS

## 2013-06-29 MED ORDER — STERILE WATER FOR IRRIGATION IR SOLN
Status: DC | PRN
  Administered 2013-06-29: 200 mL

## 2013-06-29 MED ORDER — BACITRACIN-POLYMYXIN B 500-10000 UNIT/GM OP OINT
TOPICAL_OINTMENT | OPHTHALMIC | Status: AC
  Filled 2013-06-29: qty 3.5

## 2013-06-29 MED ORDER — ACETAMINOPHEN 325 MG PO TABS: 325.0000 mg | ORAL_TABLET | ORAL | Status: DC | PRN

## 2013-06-29 MED ORDER — PROPOFOL 10 MG/ML IV BOLUS
INTRAVENOUS | Status: DC | PRN
  Administered 2013-06-29: 150 mg via INTRAVENOUS

## 2013-06-29 MED ORDER — MINERAL OIL LIGHT 100 % EX OIL
TOPICAL_OIL | CUTANEOUS | Status: AC
  Filled 2013-06-29: qty 25

## 2013-06-29 MED ORDER — CYCLOPENTOLATE HCL 1 % OP SOLN
OPHTHALMIC | Status: AC
  Filled 2013-06-29: qty 2

## 2013-06-29 MED ORDER — HEMOSTATIC AGENTS (NO CHARGE) OPTIME
TOPICAL | Status: DC | PRN
  Administered 2013-06-29: 1 via TOPICAL

## 2013-06-29 MED ORDER — ONDANSETRON HCL 4 MG/2ML IJ SOLN: 4.0000 mg | Freq: Four times a day (QID) | INTRAMUSCULAR | Status: DC | PRN

## 2013-06-29 MED ORDER — SODIUM CHLORIDE 0.45 % IV SOLN
INTRAVENOUS | Status: DC
  Administered 2013-06-29: 20 mL/h via INTRAVENOUS

## 2013-06-29 MED ORDER — HYDRALAZINE HCL 20 MG/ML IJ SOLN
INTRAMUSCULAR | Status: AC
  Filled 2013-06-29: qty 1

## 2013-06-29 MED ORDER — TROPICAMIDE 1 % OP SOLN
OPHTHALMIC | Status: AC
  Filled 2013-06-29: qty 3

## 2013-06-29 MED ORDER — AMLODIPINE BESYLATE 5 MG PO TABS
5.0000 mg | ORAL_TABLET | Freq: Every day | ORAL | Status: DC
  Filled 2013-06-29: qty 1

## 2013-06-29 MED ORDER — ACETAZOLAMIDE SODIUM 500 MG IJ SOLR
INTRAMUSCULAR | Status: AC
  Filled 2013-06-29: qty 500

## 2013-06-29 MED ORDER — EPINEPHRINE HCL 1 MG/ML IJ SOLN
INTRAMUSCULAR | Status: DC | PRN
  Administered 2013-06-29: 13:00:00

## 2013-06-29 MED ORDER — BUPIVACAINE HCL (PF) 0.75 % IJ SOLN
INTRAMUSCULAR | Status: DC | PRN
  Administered 2013-06-29: 10 mL

## 2013-06-29 MED ORDER — ACETAZOLAMIDE SODIUM 500 MG IJ SOLR
500.0000 mg | Freq: Once | INTRAMUSCULAR | Status: AC
  Administered 2013-06-30: 500 mg via INTRAVENOUS
  Filled 2013-06-29: qty 500

## 2013-06-29 MED ORDER — GATIFLOXACIN 0.5 % OP SOLN
OPHTHALMIC | Status: AC
  Filled 2013-06-29: qty 2.5

## 2013-06-29 MED ORDER — FENTANYL CITRATE 0.05 MG/ML IJ SOLN
INTRAMUSCULAR | Status: DC | PRN
  Administered 2013-06-29 (×2): 50 ug via INTRAVENOUS
  Administered 2013-06-29: 100 ug via INTRAVENOUS

## 2013-06-29 MED ORDER — CLINDAMYCIN PHOSPHATE 600 MG/50ML IV SOLN
600.0000 mg | INTRAVENOUS | Status: AC
  Administered 2013-06-29: 600 mg via INTRAVENOUS

## 2013-06-29 MED ORDER — GATIFLOXACIN 0.5 % OP SOLN
1.0000 [drp] | OPHTHALMIC | Status: AC | PRN
  Administered 2013-06-29 (×2): 1 [drp] via OPHTHALMIC

## 2013-06-29 MED ORDER — PHENYLEPHRINE HCL 2.5 % OP SOLN
OPHTHALMIC | Status: AC
  Filled 2013-06-29: qty 2

## 2013-06-29 MED ORDER — PHENYLEPHRINE HCL 2.5 % OP SOLN
1.0000 [drp] | OPHTHALMIC | Status: DC | PRN
  Administered 2013-06-29: 1 [drp] via OPHTHALMIC

## 2013-06-29 MED ORDER — EPINEPHRINE HCL 1 MG/ML IJ SOLN
INTRAMUSCULAR | Status: AC
  Filled 2013-06-29: qty 1

## 2013-06-29 MED ORDER — PREDNISONE 5 MG PO TABS
5.0000 mg | ORAL_TABLET | Freq: Every day | ORAL | Status: DC
  Administered 2013-06-30: 5 mg via ORAL
  Filled 2013-06-29 (×2): qty 1

## 2013-06-29 MED ORDER — POLYMYXIN B SULFATE 500000 UNITS IJ SOLR
INTRAMUSCULAR | Status: AC
  Filled 2013-06-29: qty 1

## 2013-06-29 MED ORDER — HYDROCODONE-ACETAMINOPHEN 5-325 MG PO TABS: 1.0000 | ORAL_TABLET | ORAL | Status: DC | PRN

## 2013-06-29 MED ORDER — ZOLPIDEM TARTRATE 5 MG PO TABS: 5.0000 mg | ORAL_TABLET | Freq: Every evening | ORAL | Status: DC | PRN

## 2013-06-29 MED ORDER — GLYCOPYRROLATE 0.2 MG/ML IJ SOLN
INTRAMUSCULAR | Status: DC | PRN
  Administered 2013-06-29: 0.6 mg via INTRAVENOUS

## 2013-06-29 MED ORDER — SODIUM CHLORIDE 0.9 % IJ SOLN
INTRAMUSCULAR | Status: DC | PRN
  Administered 2013-06-29: 14:00:00

## 2013-06-29 MED ORDER — TETRACAINE HCL 0.5 % OP SOLN
2.0000 [drp] | Freq: Once | OPHTHALMIC | Status: AC
  Administered 2013-06-30: 2 [drp] via OPHTHALMIC
  Filled 2013-06-29: qty 2

## 2013-06-29 MED ORDER — PREDNISOLONE ACETATE 1 % OP SUSP
1.0000 [drp] | Freq: Four times a day (QID) | OPHTHALMIC | Status: DC
  Filled 2013-06-29: qty 1

## 2013-06-29 MED ORDER — HYPROMELLOSE (GONIOSCOPIC) 2.5 % OP SOLN
OPHTHALMIC | Status: AC
  Filled 2013-06-29: qty 15

## 2013-06-29 MED ORDER — NEOSTIGMINE METHYLSULFATE 1 MG/ML IJ SOLN
INTRAMUSCULAR | Status: DC | PRN
  Administered 2013-06-29: 4 mg via INTRAVENOUS

## 2013-06-29 MED ORDER — PHENYLEPHRINE HCL 2.5 % OP SOLN
1.0000 [drp] | OPHTHALMIC | Status: AC | PRN
  Administered 2013-06-29 (×2): 1 [drp] via OPHTHALMIC

## 2013-06-29 MED ORDER — MAGNESIUM HYDROXIDE 400 MG/5ML PO SUSP: 15.0000 mL | Freq: Four times a day (QID) | ORAL | Status: DC | PRN

## 2013-06-29 MED ORDER — MORPHINE SULFATE 2 MG/ML IJ SOLN: 1.0000 mg | INTRAMUSCULAR | Status: DC | PRN

## 2013-06-29 MED ORDER — ATROPINE SULFATE 1 % OP SOLN
OPHTHALMIC | Status: DC | PRN
  Administered 2013-06-29: 1 [drp] via OPHTHALMIC

## 2013-06-29 MED ORDER — SODIUM HYALURONATE 10 MG/ML IO SOLN
INTRAOCULAR | Status: DC | PRN
  Administered 2013-06-29: 0.85 mL via INTRAOCULAR

## 2013-06-29 SURGICAL SUPPLY — 74 items
APPLICATOR DR MATTHEWS STRL (MISCELLANEOUS) IMPLANT
BLADE EYE CATARACT 19 1.4 BEAV (BLADE) IMPLANT
BLADE MVR KNIFE 19G (BLADE) ×2 IMPLANT
BLADE MVR KNIFE 20G (BLADE) ×2 IMPLANT
CANNULA DUAL BORE 23G (CANNULA) IMPLANT
CANNULA FLEX TIP 25G (CANNULA) ×2 IMPLANT
CANNULA VLV SOFT TIP 25GA (OPHTHALMIC) ×2 IMPLANT
CLOTH BEACON ORANGE TIMEOUT ST (SAFETY) ×2 IMPLANT
CORDS BIPOLAR (ELECTRODE) ×2 IMPLANT
COTTONBALL LRG STERILE PKG (GAUZE/BANDAGES/DRESSINGS) ×6 IMPLANT
COVER MAYO STAND STRL (DRAPES) ×2 IMPLANT
DRAPE INCISE 51X51 W/FILM STRL (DRAPES) ×2 IMPLANT
DRAPE OPHTHALMIC 77X100 STRL (CUSTOM PROCEDURE TRAY) ×2 IMPLANT
FILTER BLUE MILLIPORE (MISCELLANEOUS) IMPLANT
FILTER STRAW FLUID ASPIR (MISCELLANEOUS) IMPLANT
FORCEPS ECKARDT ILM 25G SERR (OPHTHALMIC RELATED) ×2 IMPLANT
FORCEPS GRIESHABER ILM 25G A (INSTRUMENTS) ×2 IMPLANT
GLOVE SS BIOGEL STRL SZ 6.5 (GLOVE) ×1 IMPLANT
GLOVE SS BIOGEL STRL SZ 7 (GLOVE) ×1 IMPLANT
GLOVE SUPERSENSE BIOGEL SZ 6.5 (GLOVE) ×1
GLOVE SUPERSENSE BIOGEL SZ 7 (GLOVE) ×1
GLOVE SURG 8.5 LATEX PF (GLOVE) ×2 IMPLANT
GOWN STRL NON-REIN LRG LVL3 (GOWN DISPOSABLE) ×6 IMPLANT
HANDLE PNEUMATIC FOR CONSTEL (OPHTHALMIC) ×2 IMPLANT
ILLUMINATOR CHOW PICK 25GA (MISCELLANEOUS) IMPLANT
KIT BASIN OR (CUSTOM PROCEDURE TRAY) ×2 IMPLANT
KIT PERFLUORON PROCEDURE 5ML (MISCELLANEOUS) ×2 IMPLANT
KIT ROOM TURNOVER OR (KITS) ×2 IMPLANT
KNIFE CRESCENT 2.5 55 ANG (BLADE) ×2 IMPLANT
LENS BIOM SUPER VIEW SET DISP (OPHTHALMIC RELATED) IMPLANT
MARKER SKIN DUAL TIP RULER LAB (MISCELLANEOUS) IMPLANT
MASK EYE SHIELD (GAUZE/BANDAGES/DRESSINGS) ×2 IMPLANT
MICROPICK 25G (MISCELLANEOUS)
NEEDLE 18GX1X1/2 (RX/OR ONLY) (NEEDLE) ×2 IMPLANT
NEEDLE 25GX 5/8IN NON SAFETY (NEEDLE) ×2 IMPLANT
NEEDLE 27GAX1X1/2 (NEEDLE) IMPLANT
NEEDLE FILTER BLUNT 18X 1/2SAF (NEEDLE) ×1
NEEDLE FILTER BLUNT 18X1 1/2 (NEEDLE) ×1 IMPLANT
NEEDLE HYPO 30X.5 LL (NEEDLE) ×2 IMPLANT
NS IRRIG 1000ML POUR BTL (IV SOLUTION) ×2 IMPLANT
OIL SILICONE OPHTHALMIC ADAPTO (Ophthalmic Related) ×2 IMPLANT
PACK VITRECTOMY CUSTOM (CUSTOM PROCEDURE TRAY) ×2 IMPLANT
PAD ARMBOARD 7.5X6 YLW CONV (MISCELLANEOUS) ×4 IMPLANT
PAD EYE OVAL STERILE LF (GAUZE/BANDAGES/DRESSINGS) ×2 IMPLANT
PAK PIK VITRECTOMY CVS 25GA (OPHTHALMIC) ×2 IMPLANT
PAK VITRECTOMY PIK 25 GA (OPHTHALMIC RELATED) IMPLANT
PENCIL BIPOLAR 25GA STR DISP (OPHTHALMIC RELATED) IMPLANT
PIC ILLUMINATED 25G (OPHTHALMIC) ×2
PICK MICROPICK 25G (MISCELLANEOUS) IMPLANT
PIK ILLUMINATED 25G (OPHTHALMIC) ×1 IMPLANT
PROBE DIRECTIONAL LASER (MISCELLANEOUS) IMPLANT
PROBE LASER ILLUM FLEX CVD 25G (OPHTHALMIC) ×2 IMPLANT
REPL STRA BRUSH NEEDLE (NEEDLE) ×2 IMPLANT
RESERVOIR BACK FLUSH (MISCELLANEOUS) ×2 IMPLANT
ROLLS DENTAL (MISCELLANEOUS) ×4 IMPLANT
SCRAPER DIAMOND DUST MEMBRANE (MISCELLANEOUS) ×2 IMPLANT
SPONGE SURGIFOAM ABS GEL 12-7 (HEMOSTASIS) ×2 IMPLANT
STOPCOCK 4 WAY LG BORE MALE ST (IV SETS) IMPLANT
SUT CHROMIC 7 0 TG140 8 (SUTURE) IMPLANT
SUT ETHILON 10 0 CS140 6 (SUTURE) IMPLANT
SUT ETHILON 9 0 TG140 8 (SUTURE) IMPLANT
SUT POLY NON ABSORB 10-0 8 STR (SUTURE) IMPLANT
SUT SILK 4 0 RB 1 (SUTURE) IMPLANT
SYR 20CC LL (SYRINGE) ×2 IMPLANT
SYR 5ML LL (SYRINGE) IMPLANT
SYR BULB 3OZ (MISCELLANEOUS) ×2 IMPLANT
SYR TB 1ML LUER SLIP (SYRINGE) ×2 IMPLANT
SYRINGE 10CC LL (SYRINGE) IMPLANT
TAPE SURG TRANSPORE 1 IN (GAUZE/BANDAGES/DRESSINGS) ×1 IMPLANT
TAPE SURGICAL TRANSPORE 1 IN (GAUZE/BANDAGES/DRESSINGS) ×1
TOWEL OR 17X24 6PK STRL BLUE (TOWEL DISPOSABLE) ×6 IMPLANT
TROCAR CANNULA 25GA (CANNULA) IMPLANT
WATER STERILE IRR 1000ML POUR (IV SOLUTION) ×2 IMPLANT
WIPE INSTRUMENT VISIWIPE 73X73 (MISCELLANEOUS) ×2 IMPLANT

## 2013-06-29 NOTE — Brief Op Note (Signed)
Brief Operative note   Preoperative diagnosis:  Pre-Op Diagnosis Codes:    * Retinal detachment with retinal defect, unspecified [361.00] Postoperative diagnosis  Post-Op Diagnosis Codes:    * Retinal detachment with retinal defect, unspecified [361.00]  Procedures: Pars plana vitrectomy, laser, PFO injection and removal,  Silicone Oil injection  Left eye  Surgeon:  Sherrie George, MD...  Assistant:  Rosalie Doctor SA   Anesthesia: General  Specimen: none  Estimated blood loss:  1cc  Complications: none  Patient sent to PACU in good condition  Composed by Sherrie George MD  Dictation number: [phone]

## 2013-06-29 NOTE — Progress Notes (Addendum)
Dr Ashley Royalty called ZO:XWRUEAV to penicillin. New orders for antibiotic Dr Saunders Revel called for echo and notes from 5/14, cancel ancef

## 2013-06-29 NOTE — Anesthesia Postprocedure Evaluation (Signed)
Anesthesia Post Note  Patient: Timothy Pena  Procedure(s) Performed: Procedure(s) (LRB): 25 GAUGE PARS PLANA VITRECTOMY WITH 20 GAUGE MVR PORT; Membrame peel; Gas exchange; Laser treatment; Silicone oil injection; Perfluoron injection (Left)  Anesthesia type: general  Patient location: PACU  Post pain: Pain level controlled  Post assessment: Patient's Cardiovascular Status Stable  Last Vitals:  Filed Vitals:   06/29/13 1600  BP:   Pulse: 60  Temp:   Resp: 12    Post vital signs: Reviewed and stable  Level of consciousness: sedated  Complications: No apparent anesthesia complications

## 2013-06-29 NOTE — H&P (Signed)
I examined the patient today and there is no change in the medical status 

## 2013-06-29 NOTE — Transfer of Care (Signed)
Immediate Anesthesia Transfer of Care Note  Patient: Timothy Pena  Procedure(s) Performed: Procedure(s): 25 GAUGE PARS PLANA VITRECTOMY WITH 20 GAUGE MVR PORT; Membrame peel; Gas exchange; Laser treatment; Silicone oil injection; Perfluoron injection (Left)  Patient Location: PACU  Anesthesia Type:General  Level of Consciousness: awake, alert  and oriented  Airway & Oxygen Therapy: Patient Spontanous Breathing and Patient connected to nasal cannula oxygen  Post-op Assessment: Report given to PACU RN and Post -op Vital signs reviewed and stable  Post vital signs: Reviewed and stable  Complications: No apparent anesthesia complications

## 2013-06-29 NOTE — Op Note (Signed)
NAMEFAHED, MORTEN NO.:  [id number]  MEDICAL RECORD NO.:  [id number]  LOCATION:  6N04C                        FACILITY:  [facility]  PHYSICIAN:  Beulah Gandy. Ashley Royalty, M.D. DATE OF BIRTH:  05-07-1941  DATE OF PROCEDURE:  06/29/2013 DATE OF DISCHARGE:                              OPERATIVE REPORT   ADMISSION DIAGNOSES:  Proliferative vitreal retinopathy, retinal detachment, combined traction and rhegmatogenous retinal detachment, left eye.  PROCEDURES:  Pars plana vitrectomy, Perfluoron injection, membrane peel, retinal photocoagulation, gas fluid exchange, silicone oil injection, all in the left eye.  SURGEON:  Beulah Gandy. Ashley Royalty, M.D.  ASSISTANT:  Rosalie Doctor, SA.  ANESTHESIA:  General.  DETAILS:  After usual prep and drape, 25-gauge trocar was placed at 10 and 4 o'clock with infusion at 4 o'clock.  A 19-gauge opening was made in the sclera at 2 o'clock.  Provisc was placed on the corneal surface and the biome viewing system was moved into place.  Pars plana vitrectomy was begun just behind the pseudophakos where dense vitreous membranes were seen.  The retina was thrown into tight folds, the folds were each teased open with the vitreous cutter with the shave mode and vitreous forceps were used to grasp the membranes and strip them from the surface of the retina.  Each quadrant was treated and vitreous was removed in the anterior-posterior orientation.  Once all the traction was relieved, Perfluoron was injected over the optic nerve to reattach the retina.  As the retina reattached, the breaks fell nicely on the scleral buckle.  The endolaser was positioned in the eye.  1064 burns were placed around the retinal breaks and around the retinal periphery. The power was 400 mW 1000 microns each and 0.1 seconds each.  A gas fluid exchange was carried out, removing all the gas over the Perfluoron, this caused the anterior retina to lie nicely on the scleral buckle and  the pars plana.  The gas continued and PFO was removed with the soft-tipped cannula.  Once there was a total gas fill, a washout procedure was performed to remove all PFO.  The endolaser was positioned in the eye and additional laser was performed around the periphery. Additional fluid was removed from the vitreous cavity.  A peripheral iridectomy was performed at 5 o'clock with the vitreous cutter from the vitreous cavity.  Silicone oil was then injected to fill the vitreous cavity.  Once this was accomplished, it was apparent that the retina was fully reattached.  All gas was removed from the eye.  The instruments were removed from the eye.  9-0 nylon was used to close the sclerotomy site at 2 o'clock.  Conjunctiva was closed with wet-field cautery. Polymyxin and gentamicin were irrigated into tenon space, atropine solution was applied.  Marcaine was injected around the globe for postop pain.  Decadron 10 mg was injected into the lower subconjunctival space.  Closing pressure was 10 with a Risk manager.  Polysporin ophthalmic ointment, a patch and shield were placed.  The patient was awakened and taken to recovery in a satisfactory condition.  Duration 1-1/2 hours.     Beulah Gandy. Ashley Royalty, M.D.     JDM/MEDQ  D:  06/29/2013  T:  06/29/2013  Job:  [id number]

## 2013-06-29 NOTE — Anesthesia Preprocedure Evaluation (Addendum)
Anesthesia Evaluation  Patient identified by MRN, date of birth, ID band Patient awake    Reviewed: Allergy & Precautions, H&P , NPO status , Patient's Chart, lab work & pertinent test results, reviewed documented beta blocker date and time   History of Anesthesia Complications Negative for: history of anesthetic complications  Airway Mallampati: II TM Distance: >3 FB     Dental  (+) Dental Advisory Given, Edentulous Upper, Edentulous Lower, Upper Dentures and Lower Dentures   Pulmonary Current Smoker,          Cardiovascular hypertension, Pt. on medications and Pt. on home beta blockers     Neuro/Psych Anxiety negative neurological ROS     GI/Hepatic negative GI ROS, Neg liver ROS,   Endo/Other  negative endocrine ROS  Renal/GU negative Renal ROS     Musculoskeletal negative musculoskeletal ROS (+)   Abdominal   Peds  Hematology negative hematology ROS (+) Blood dyscrasia, ,   Anesthesia Other Findings   Reproductive/Obstetrics negative OB ROS                          Anesthesia Physical Anesthesia Plan  ASA: III  Anesthesia Plan:    Post-op Pain Management:    Induction:   Airway Management Planned:   Additional Equipment:   Intra-op Plan:   Post-operative Plan:   Informed Consent:   Plan Discussed with:   Anesthesia Plan Comments:         Anesthesia Quick Evaluation

## 2013-06-29 NOTE — Preoperative (Signed)
Beta Blockers   Reason not to administer Beta Blockers:Not Applicable 

## 2013-06-30 ENCOUNTER — Encounter (HOSPITAL_COMMUNITY): Payer: Self-pay | Admitting: Ophthalmology

## 2013-06-30 MED ORDER — GATIFLOXACIN 0.5 % OP SOLN: 1.0000 [drp] | Freq: Four times a day (QID) | OPHTHALMIC | Status: DC

## 2013-06-30 MED ORDER — PREDNISONE 5 MG PO TABS: 5.0000 mg | ORAL_TABLET | Freq: Every day | ORAL | Status: DC

## 2013-06-30 MED ORDER — BRIMONIDINE TARTRATE 0.2 % OP SOLN: 1.0000 [drp] | Freq: Two times a day (BID) | OPHTHALMIC | Status: DC

## 2013-06-30 MED ORDER — BACITRACIN-POLYMYXIN B 500-10000 UNIT/GM OP OINT: 1.0000 "application " | TOPICAL_OINTMENT | Freq: Four times a day (QID) | OPHTHALMIC | Status: DC

## 2013-06-30 MED ORDER — PREDNISOLONE ACETATE 1 % OP SUSP: 1.0000 [drp] | Freq: Four times a day (QID) | OPHTHALMIC | Status: DC

## 2013-06-30 NOTE — Progress Notes (Signed)
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  Discharge to home.  Give post operative instructions  Sherrie George 06/30/2013, 6:37 AM

## 2013-06-30 NOTE — Discharge Summary (Signed)
Discharge summary not needed on [facility] patients per medical records. 

## 2013-07-02 ENCOUNTER — Encounter (INDEPENDENT_AMBULATORY_CARE_PROVIDER_SITE_OTHER): Payer: Medicare Other | Admitting: Ophthalmology

## 2013-07-02 DIAGNOSIS — H33009 Unspecified retinal detachment with retinal break, unspecified eye: Secondary | ICD-10-CM

## 2013-07-23 ENCOUNTER — Encounter (INDEPENDENT_AMBULATORY_CARE_PROVIDER_SITE_OTHER): Payer: Medicare Other | Admitting: Ophthalmology

## 2013-07-23 DIAGNOSIS — H33009 Unspecified retinal detachment with retinal break, unspecified eye: Secondary | ICD-10-CM

## 2013-09-03 ENCOUNTER — Encounter (INDEPENDENT_AMBULATORY_CARE_PROVIDER_SITE_OTHER): Payer: Medicare Other | Admitting: Ophthalmology

## 2013-09-03 DIAGNOSIS — H33009 Unspecified retinal detachment with retinal break, unspecified eye: Secondary | ICD-10-CM

## 2013-10-24 ENCOUNTER — Encounter: Payer: Self-pay | Admitting: Interventional Cardiology

## 2013-10-25 ENCOUNTER — Encounter: Payer: Self-pay | Admitting: Interventional Cardiology

## 2013-10-25 ENCOUNTER — Ambulatory Visit (INDEPENDENT_AMBULATORY_CARE_PROVIDER_SITE_OTHER): Payer: Medicare Other | Admitting: Interventional Cardiology

## 2013-10-25 VITALS — BP 110/58 | HR 56 | Ht 68.0 in | Wt 138.0 lb

## 2013-10-25 DIAGNOSIS — F17200 Nicotine dependence, unspecified, uncomplicated: Secondary | ICD-10-CM

## 2013-10-25 DIAGNOSIS — I498 Other specified cardiac arrhythmias: Secondary | ICD-10-CM

## 2013-10-25 DIAGNOSIS — I70219 Atherosclerosis of native arteries of extremities with intermittent claudication, unspecified extremity: Secondary | ICD-10-CM

## 2013-10-25 DIAGNOSIS — I10 Essential (primary) hypertension: Secondary | ICD-10-CM

## 2013-10-25 NOTE — Patient Instructions (Signed)
Your physician recommends that you schedule a follow-up appointment as needed  

## 2013-10-25 NOTE — Progress Notes (Signed)
Patient ID: Timothy Pena, male   DOB: [birth year], 72 y.o.   MRN: [id number]    [address] [place], [place]  [address] Phone: [phone] Fax:  [phone]  Date:  10/25/2013   ID:  Renato Battles, DOB August 05, 1941, MRN [id number]  PCP:  Astrid Divine, MD      History of Present Illness: Timothy Pena is a 72 y.o. male who had SVT at the time of a hip fracture. He denies any CP, or SHOB. No palpitations. He stopped smoking. He wants to quit. It wa snoted by a podiatrist that he had decreased pedal pulses. He has some thigh pain with walking, on the left. Palpitations:  Walks amd stretches. No frther therapy. Denies : Chest pain.  Dyspnea on exertion.  Orthopnea.  Palpitations.  Leg edema.     Wt Readings from Last 3 Encounters:  10/25/13 138 lb (62.596 kg)  06/29/13 125 lb (56.7 kg)  06/29/13 125 lb (56.7 kg)     Past Medical History  Diagnosis Date  . Hypertension   . Seasonal allergies   . Complication of anesthesia     Constipation  . Anxiety     Due to surgeries  . Arthritis     Current Outpatient Prescriptions  Medication Sig Dispense Refill  . amLODipine (NORVASC) 5 MG tablet Take 1 tablet (5 mg total) by mouth daily.  30 tablet  0  . bacitracin-polymyxin b (POLYSPORIN) ophthalmic ointment Place 1 application into the left eye 4 (four) times daily. apply to eye every 12 hours while awake  3.5 g  0  . metoprolol tartrate (LOPRESSOR) 25 MG tablet Take 1 tablet (25 mg total) by mouth 2 (two) times daily.  60 tablet  0  . brimonidine (ALPHAGAN) 0.2 % ophthalmic solution Place 1 drop into the left eye 2 (two) times daily.  5 mL  12  . dorzolamide-timolol (COSOPT) 22.3-6.8 MG/ML ophthalmic solution Place 1 drop into the left eye 2 (two) times daily.  10 mL  12  . gatifloxacin (ZYMAXID) 0.5 % SOLN Place 1 drop into the left eye 4 (four) times daily.      . prednisoLONE acetate (PRED FORTE) 1 % ophthalmic suspension Place 1 drop into the left  eye 4 (four) times daily.  5 mL  0  . prednisoLONE acetate (PRED FORTE) 1 % ophthalmic suspension Place 1 drop into the left eye 4 (four) times daily.  5 mL  0   No current facility-administered medications for this visit.    Allergies:    Allergies  Allergen Reactions  . Penicillins Swelling    Throat swelling Throat swelling    Social History:  The patient  reports that he has been smoking Cigarettes.  He has a 10.6 pack-year smoking history. He has never used smokeless tobacco. He reports that he drinks about 1.8 ounces of alcohol per week. He reports that he does not use illicit drugs.   Family History:  The patient's family history is not on file.   ROS:  Please see the history of present illness.  No nausea, vomiting.  No fevers, chills.  No focal weakness.  No dysuria.    All other systems reviewed and negative.   PHYSICAL EXAM: VS:  BP 110/58  Pulse 56  Ht 5\' 8"  (1.727 m)  Wt 138 lb (62.596 kg)  BMI 20.99 kg/m2 Well nourished, well developed, in no acute distress HEENT: normal Neck: no JVD, no carotid bruits Cardiac:  normal S1,  S2; RRR;  Lungs:  clear to auscultation bilaterally, no wheezing, rhonchi or rales Abd: soft, nontender, no hepatomegaly Ext: no edema Skin: warm and dry Neuro:   no focal abnormalities noted  Stress test reviewed. No ischemia. Normal LV function     ASSESSMENT AND PLAN:  SVT (supraventricular tachycardia)  Continue Metoprolol Tartrate Tablet, 25 MG, 1 tablet, Orally, Twice a day Notes: is unclear what rhythm the patient had. At last visit, the patient's wife specifically stated that this was atrial fibrillation. The discharge summary does not report atrial fibrillation. rhythm may have been a result of the stress of his hip fracture and subsequent surgery. Continue metoprolol. If he has  symptoms of palpitations, would place Life[employer] monitor on him. He does not recall symptoms of palpitations in the hospital. He has not had any symptoms  recently. He will be having [facility] for a detached retina [facility] He had a negative stress test several months ago. No further cardiac testing is needed prior to surgery.    2. Tobacco dependence   he has stop smoking cold [place], for the past 2.5-3 months.    3. Hypertension  Continue Amlodipine Besylate Tablet, 5 MG, 1 tablet, Orally, Once a day Notes: better control. Continue to try to check outside the doctor's office.    4. Atherosclerosis of native arteries of the extremities with intermittent claudication  IMAGING: EC Lower Extremity Doppler (Ordered for 05/04/2013) Notes: Some pain on the left. Occluded right SFA by Doppler. No significant claudication. No wound healing problems.    Preventive Medicine  Adult topics discussed:  Smoking cessation:  Patient advised continue to abstain from smoking      Signed, Fredric Mare, MD, [facility] 10/25/2013 2:38 PM

## 2013-11-08 ENCOUNTER — Encounter (INDEPENDENT_AMBULATORY_CARE_PROVIDER_SITE_OTHER): Payer: Medicare Other | Admitting: Ophthalmology

## 2014-03-30 ENCOUNTER — Encounter: Payer: Self-pay | Admitting: Podiatry

## 2014-03-30 ENCOUNTER — Ambulatory Visit (INDEPENDENT_AMBULATORY_CARE_PROVIDER_SITE_OTHER): Payer: Medicare Other | Admitting: Podiatry

## 2014-03-30 VITALS — BP 159/84 | HR 65 | Resp 12

## 2014-03-30 DIAGNOSIS — B351 Tinea unguium: Secondary | ICD-10-CM

## 2014-03-30 DIAGNOSIS — M79609 Pain in unspecified limb: Secondary | ICD-10-CM

## 2014-03-30 NOTE — Progress Notes (Signed)
   Subjective:    Patient ID: Timothy Pena, male    DOB: 11-11-1941, 73 y.o.   MRN: [id number]  HPI'' TOENAILS TRIM.''    Review of Systems  All other systems reviewed and are negative.      Objective:   Physical Exam  Orientated x3 male  Vascular: DP is are 0/4 bilaterally. Right PT is 0/4 and left PT 2/4  Neurological: Sensation to 10 g monofilament wire intact 5/5 bilaterally. Vibratory sensation intact bilaterally. Ankle reflexes reactive bilaterally.  Dermatological: Elongated, incurvated, discolored toenails x10  Musculoskeletal: Hammertoe deformities 2-5 bilaterally.       Assessment & Plan:   Assessment: Peripheral arterial disease. Patient's cardiologist has evaluated the peripheral arterial disease November  2014, and it did not recommend any active treatment.   Symptomatic onychomycoses x10  Plan: Nails x10 are debrided down any bleeding Patient's peripheral arterial disease is being evaluated by his cardiologist.  Reappoint x3 months

## 2014-03-31 ENCOUNTER — Encounter: Payer: Self-pay | Admitting: Podiatry

## 2014-06-29 ENCOUNTER — Encounter: Payer: Self-pay | Admitting: Podiatry

## 2014-06-29 ENCOUNTER — Ambulatory Visit (INDEPENDENT_AMBULATORY_CARE_PROVIDER_SITE_OTHER): Payer: Medicare Other | Admitting: Podiatry

## 2014-06-29 VITALS — BP 155/83 | HR 58 | Resp 16

## 2014-06-29 DIAGNOSIS — M79609 Pain in unspecified limb: Secondary | ICD-10-CM

## 2014-06-29 DIAGNOSIS — M79673 Pain in unspecified foot: Secondary | ICD-10-CM

## 2014-06-29 DIAGNOSIS — B351 Tinea unguium: Secondary | ICD-10-CM

## 2014-06-30 NOTE — Progress Notes (Signed)
Patient ID: Timothy Pena, male   DOB: 01-24-1941, 73 y.o.   MRN: [id number]  Subjective: This patient presents complaining of painful toenails  Objective: Hypertrophic, incurvated, discolored toenails 6-10  Assessment: Symptomatic onychomycoses 6-10  Plan: Nails x10 are debrided without any bleeding  Report x3 months

## 2014-09-12 ENCOUNTER — Encounter (INDEPENDENT_AMBULATORY_CARE_PROVIDER_SITE_OTHER): Payer: Medicare Other | Admitting: Ophthalmology

## 2014-09-12 DIAGNOSIS — I10 Essential (primary) hypertension: Secondary | ICD-10-CM

## 2014-09-12 DIAGNOSIS — H35033 Hypertensive retinopathy, bilateral: Secondary | ICD-10-CM

## 2014-09-12 DIAGNOSIS — H43813 Vitreous degeneration, bilateral: Secondary | ICD-10-CM

## 2014-09-12 DIAGNOSIS — H338 Other retinal detachments: Secondary | ICD-10-CM

## 2014-10-05 ENCOUNTER — Encounter: Payer: Self-pay | Admitting: Podiatry

## 2014-10-05 ENCOUNTER — Ambulatory Visit (INDEPENDENT_AMBULATORY_CARE_PROVIDER_SITE_OTHER): Payer: Medicare Other | Admitting: Podiatry

## 2014-10-05 VITALS — BP 154/82 | HR 60 | Resp 12

## 2014-10-05 DIAGNOSIS — M79676 Pain in unspecified toe(s): Secondary | ICD-10-CM

## 2014-10-05 DIAGNOSIS — B351 Tinea unguium: Secondary | ICD-10-CM

## 2014-10-05 NOTE — Progress Notes (Signed)
Patient ID: Timothy Pena, male   DOB: October 31, 1941, 73 y.o.   MRN: [id number]  Subjective: This patient presents again complaining of painful toenails  Objective: Elongated, hypertrophic, discolored toenails 6-10  Assessment: Symptomatic onychomycosis 6-10  Plan: Debrided toenails 10 without any bleeding  Reappoint 3 months

## 2015-01-11 ENCOUNTER — Ambulatory Visit: Payer: Medicare Other | Admitting: Podiatry

## 2015-01-16 ENCOUNTER — Ambulatory Visit (INDEPENDENT_AMBULATORY_CARE_PROVIDER_SITE_OTHER): Payer: Medicare Other | Admitting: Podiatry

## 2015-01-16 ENCOUNTER — Encounter: Payer: Self-pay | Admitting: Podiatry

## 2015-01-16 DIAGNOSIS — B351 Tinea unguium: Secondary | ICD-10-CM

## 2015-01-16 DIAGNOSIS — M79676 Pain in unspecified toe(s): Secondary | ICD-10-CM

## 2015-01-16 NOTE — Progress Notes (Signed)
Patient ID: Timothy Pena, male   DOB: 1941-03-23, 74 y.o.   MRN: [id number]  Subjective: This patient presents again complaining of painful toenails  Objective: The toenails are hypertrophic, incurvated, discolored and tender to palpation 6-10  Assessment: Symptomatic onychomycoses 6-10  Plan: Debridement of toenails 10 Pinpoint bleeding distal left hallux treated with topical antibiotic ointment and Band-Aid  Patient advised to continue to apply topical antibiotic ointment and Band-Aid daily to the left hallux until healed  Reappoint 3 months

## 2015-01-16 NOTE — Patient Instructions (Signed)
Apply topical antibiotic ointment daily to the end of the left big toe and cover with a Band-Aid until a scab forms

## 2015-04-21 ENCOUNTER — Ambulatory Visit
Admission: RE | Admit: 2015-04-21 | Discharge: 2015-04-21 | Disposition: A | Discharge status: Home / Self Care | Payer: Medicare Other | Source: Ambulatory Visit | Attending: Family Medicine | Admitting: Family Medicine

## 2015-04-21 ENCOUNTER — Other Ambulatory Visit: Payer: Self-pay | Admitting: Family Medicine

## 2015-04-21 DIAGNOSIS — F17200 Nicotine dependence, unspecified, uncomplicated: Secondary | ICD-10-CM

## 2015-04-21 DIAGNOSIS — R634 Abnormal weight loss: Secondary | ICD-10-CM

## 2015-04-24 ENCOUNTER — Encounter: Payer: Self-pay | Admitting: Podiatry

## 2015-04-24 ENCOUNTER — Ambulatory Visit (INDEPENDENT_AMBULATORY_CARE_PROVIDER_SITE_OTHER): Payer: Medicare Other | Admitting: Podiatry

## 2015-04-24 DIAGNOSIS — M79676 Pain in unspecified toe(s): Secondary | ICD-10-CM

## 2015-04-24 DIAGNOSIS — B351 Tinea unguium: Secondary | ICD-10-CM | POA: Diagnosis not present

## 2015-04-24 NOTE — Progress Notes (Signed)
Patient ID: Timothy Pena, male   DOB: Jul 05, 1941, 74 y.o.   MRN: [id number]  Subjective: This patient presents today complaining of painful toenails and requests nail debridement  Objective: Orientated 3 The toenails are incurvated, elongated, hypertrophic, discolored and tender to direct palpation 6-10  Assessment: Symptomatic onychomycoses 6-10  Plan: Debrided toenails 10 without any bleeding  Reappoint 3 months

## 2015-05-26 ENCOUNTER — Other Ambulatory Visit: Payer: Self-pay | Admitting: Family Medicine

## 2015-05-26 DIAGNOSIS — R9389 Abnormal findings on diagnostic imaging of other specified body structures: Secondary | ICD-10-CM

## 2015-05-26 DIAGNOSIS — F0390 Unspecified dementia without behavioral disturbance: Secondary | ICD-10-CM

## 2015-05-26 DIAGNOSIS — R634 Abnormal weight loss: Secondary | ICD-10-CM

## 2015-05-29 ENCOUNTER — Other Ambulatory Visit: Payer: Self-pay | Admitting: Family Medicine

## 2015-05-31 ENCOUNTER — Ambulatory Visit
Admission: RE | Admit: 2015-05-31 | Discharge: 2015-05-31 | Disposition: A | Discharge status: Home / Self Care | Payer: Medicare Other | Source: Ambulatory Visit | Attending: Family Medicine | Admitting: Family Medicine

## 2015-05-31 DIAGNOSIS — R634 Abnormal weight loss: Secondary | ICD-10-CM

## 2015-05-31 DIAGNOSIS — R9389 Abnormal findings on diagnostic imaging of other specified body structures: Secondary | ICD-10-CM

## 2015-05-31 DIAGNOSIS — F0390 Unspecified dementia without behavioral disturbance: Secondary | ICD-10-CM

## 2015-05-31 MED ORDER — IOPAMIDOL (ISOVUE-300) INJECTION 61%
75.0000 mL | Freq: Once | INTRAVENOUS | Status: AC | PRN
  Administered 2015-05-31: 75 mL via INTRAVENOUS

## 2015-06-05 ENCOUNTER — Telehealth: Payer: Self-pay | Admitting: *Deleted

## 2015-06-05 DIAGNOSIS — R918 Other nonspecific abnormal finding of lung field: Secondary | ICD-10-CM | POA: Insufficient documentation

## 2015-06-05 NOTE — Telephone Encounter (Signed)
Oncology Nurse Navigator Documentation  Oncology Nurse Navigator Flowsheets 06/05/2015  Referral date to RadOnc/MedOnc 06/02/2015  Navigator Encounter Type Introductory phone call/I received a referral on Mr. Crocket 06/01/15.  I updated Dr. Julien Nordmann on referral and scheduled patient to be seen with Dr. Julien Nordmann on 06/07/15.  I left a vm message regarding appt.   Time Spent with Patient 15

## 2015-06-06 ENCOUNTER — Telehealth: Payer: Self-pay | Admitting: Internal Medicine

## 2015-06-06 ENCOUNTER — Encounter: Payer: Medicare Other | Admitting: Cardiothoracic Surgery

## 2015-06-06 ENCOUNTER — Telehealth: Payer: Self-pay | Admitting: *Deleted

## 2015-06-06 NOTE — Telephone Encounter (Signed)
s.w. pt wife and advised on june appt

## 2015-06-06 NOTE — Telephone Encounter (Signed)
Oncology Nurse Navigator Documentation  Oncology Nurse Navigator Flowsheets 06/06/2015  Navigator Encounter Type Introductory phone call/I called to remind patient regarding appt with Dr. Julien Nordmann tomorrow.  I left a vm message about appt time and place.   Treatment Phase Abnormal Scans  Coordination of Care MD Appointments  Time Spent with Patient 15

## 2015-06-07 ENCOUNTER — Telehealth: Payer: Self-pay | Admitting: Internal Medicine

## 2015-06-07 ENCOUNTER — Ambulatory Visit (HOSPITAL_BASED_OUTPATIENT_CLINIC_OR_DEPARTMENT_OTHER): Payer: Medicare Other | Admitting: Internal Medicine

## 2015-06-07 ENCOUNTER — Encounter: Payer: Self-pay | Admitting: Internal Medicine

## 2015-06-07 VITALS — BP 102/57 | HR 96 | Temp 98.3°F | Resp 18 | Ht 68.0 in | Wt 111.6 lb

## 2015-06-07 DIAGNOSIS — R918 Other nonspecific abnormal finding of lung field: Secondary | ICD-10-CM

## 2015-06-07 DIAGNOSIS — R634 Abnormal weight loss: Secondary | ICD-10-CM

## 2015-06-07 MED ORDER — METHYLPREDNISOLONE 4 MG PO TBPK: ORAL_TABLET | ORAL | Status: AC

## 2015-06-07 NOTE — Patient Instructions (Signed)
Smoking Cessation Quitting smoking is important to your health and has many advantages. However, it is not always easy to quit since nicotine is a very addictive drug. Oftentimes, people try 3 times or more before being able to quit. This document explains the best ways for you to prepare to quit smoking. Quitting takes hard work and a lot of effort, but you can do it. ADVANTAGES OF QUITTING SMOKING  You will live longer, feel better, and live better.  Your body will feel the impact of quitting smoking almost immediately.  Within 20 minutes, blood pressure decreases. Your pulse returns to its normal level.  After 8 hours, carbon monoxide levels in the blood return to normal. Your oxygen level increases.  After 24 hours, the chance of having a heart attack starts to decrease. Your breath, hair, and body stop smelling like smoke.  After 48 hours, damaged nerve endings begin to recover. Your sense of taste and smell improve.  After 72 hours, the body is virtually free of nicotine. Your bronchial tubes relax and breathing becomes easier.  After 2 to 12 weeks, lungs can hold more air. Exercise becomes easier and circulation improves.  The risk of having a heart attack, stroke, cancer, or lung disease is greatly reduced.  After 1 year, the risk of coronary heart disease is cut in half.  After 5 years, the risk of stroke falls to the same as a nonsmoker.  After 10 years, the risk of lung cancer is cut in half and the risk of other cancers decreases significantly.  After 15 years, the risk of coronary heart disease drops, usually to the level of a nonsmoker.  If you are pregnant, quitting smoking will improve your chances of having a healthy baby.  The people you live with, especially any children, will be healthier.  You will have extra money to spend on things other than cigarettes. QUESTIONS TO THINK ABOUT BEFORE ATTEMPTING TO QUIT You may want to talk about your answers with your  health care provider.  Why do you want to quit?  If you tried to quit in the past, what helped and what did not?  What will be the most difficult situations for you after you quit? How will you plan to handle them?  Who can help you through the tough times? Your family? Friends? A health care provider?  What pleasures do you get from smoking? What ways can you still get pleasure if you quit? Here are some questions to ask your health care provider:  How can you help me to be successful at quitting?  What medicine do you think would be best for me and how should I take it?  What should I do if I need more help?  What is smoking withdrawal like? How can I get information on withdrawal? GET READY  Set a quit date.  Change your environment by getting rid of all cigarettes, ashtrays, matches, and lighters in your home, car, or work. Do not let people smoke in your home.  Review your past attempts to quit. Think about what worked and what did not. GET SUPPORT AND ENCOURAGEMENT You have a better chance of being successful if you have help. You can get support in many ways.  Tell your family, friends, and coworkers that you are going to quit and need their support. Ask them not to smoke around you.  Get individual, group, or telephone counseling and support. Programs are available at local hospitals and health centers. Call   your local health department for information about programs in your area.  Spiritual beliefs and practices may help some smokers quit.  Download a "quit meter" on your computer to keep track of quit statistics, such as how long you have gone without smoking, cigarettes not smoked, and money saved.  Get a self-help book about quitting smoking and staying off tobacco. LEARN NEW SKILLS AND BEHAVIORS  Distract yourself from urges to smoke. Talk to someone, go for a walk, or occupy your time with a task.  Change your normal routine. Take a different route to work.  Drink tea instead of coffee. Eat breakfast in a different place.  Reduce your stress. Take a hot bath, exercise, or read a book.  Plan something enjoyable to do every day. Reward yourself for not smoking.  Explore interactive web-based programs that specialize in helping you quit. GET MEDICINE AND USE IT CORRECTLY Medicines can help you stop smoking and decrease the urge to smoke. Combining medicine with the above behavioral methods and support can greatly increase your chances of successfully quitting smoking.  Nicotine replacement therapy helps deliver nicotine to your body without the negative effects and risks of smoking. Nicotine replacement therapy includes nicotine gum, lozenges, inhalers, nasal sprays, and skin patches. Some may be available over-the-counter and others require a prescription.  Antidepressant medicine helps people abstain from smoking, but how this works is unknown. This medicine is available by prescription.  Nicotinic receptor partial agonist medicine simulates the effect of nicotine in your brain. This medicine is available by prescription. Ask your health care provider for advice about which medicines to use and how to use them based on your health history. Your health care provider will tell you what side effects to look out for if you choose to be on a medicine or therapy. Carefully read the information on the package. Do not use any other product containing nicotine while using a nicotine replacement product.  RELAPSE OR DIFFICULT SITUATIONS Most relapses occur within the first 3 months after quitting. Do not be discouraged if you start smoking again. Remember, most people try several times before finally quitting. You may have symptoms of withdrawal because your body is used to nicotine. You may crave cigarettes, be irritable, feel very hungry, cough often, get headaches, or have difficulty concentrating. The withdrawal symptoms are only temporary. They are strongest  when you first quit, but they will go away within 10-14 days. To reduce the chances of relapse, try to:  Avoid drinking alcohol. Drinking lowers your chances of successfully quitting.  Reduce the amount of caffeine you consume. Once you quit smoking, the amount of caffeine in your body increases and can give you symptoms, such as a rapid heartbeat, sweating, and anxiety.  Avoid smokers because they can make you want to smoke.  Do not let weight gain distract you. Many smokers will gain weight when they quit, usually less than 10 pounds. Eat a healthy diet and stay active. You can always lose the weight gained after you quit.  Find ways to improve your mood other than smoking. FOR MORE INFORMATION  www.smokefree.gov  Document Released: 11/19/2001 Document Revised: 04/11/2014 Document Reviewed: 03/05/2012 [employer] Patient Information 2015 [employer]. This information is not intended to replace advice given to you by your health care provider. Make sure you discuss any questions you have with your health care provider.  

## 2015-06-07 NOTE — Telephone Encounter (Signed)
Gave and printed appt sched and avs fo rpt for July...the patient declined nut appt

## 2015-06-07 NOTE — Progress Notes (Signed)
[place] [phone]   [phone]  CONSULT NOTE  REFERRING PHYSICIAN: Dr. Kelton Pillar.  REASON FOR CONSULTATION:   74 years old white male with highly suspicious lung cancer.  HPI Timothy Pena is a 74 y.o. male with past medical history significant for hypertension, cardiac arrhythmia, retina detachment as well as left hip fractures 2 years ago and long history of smoking but quit 2 years ago.  The patient was seen by his primary care physician Dr. Laurann Montana complaining of significant weight loss around 30 pounds over few months started since November 2015.  During his evaluation Dr. Laurann Montana ordered CT scan of the chest which was performed on 05/31/2015 and it showed Pancoast-type left apical lung tumor with pleural implants, paraspinal soft tissue invasion, left second and third rib erosion, left lung pulmonary metastasis, adrenal metastasis, liver metastasis as well as possibly splenic metastasis. There was also a more homogeneous appearing and daily and gated 7 CM soft tissue mass in the anterior mediastinum favored to be unrelated and thymic in origin.  There was also a suspicious mild enlargement of a left hilar node measuring up to 8 mm suspicious for early nodal metastasis. There was also a small layering left pleural effusion. CT scan of the head  Without contrastperformed on the same day showed no metastatic disease.  Dr. Laurann Montana kindly referred the patient to me today for evaluation and recommendation regarding these abnormality in his scan.  When seen today the patient has significant fatigue and weakness.  He also has shortness of breath but no significant cough or hemoptysis. He continues to complain of back pain as well as pain in the left axilla.  He denied having any significant headache or visual changes. He has no nausea or vomiting.  Family history significant for a father who died at age 14 with lung cancer , mother died at age 37 with  natural causes, sister had lung cancer.  The patient is married but he lives alone separate from his wife.  He was accompanied by his wife Timothy Pena.  The patient has 2 children, one deceased and the other son in [place].  He used to work in a [employer], [employer]/gallery.  He has a history of smoking 1 pack per day for around 55 years but quit 2 years ago. He drinks alcohol at regular basis but no history of drug abuse.  HPI  Past Medical History  Diagnosis Date  . Hypertension   . Seasonal allergies   . Complication of anesthesia     Constipation  . Anxiety     Due to surgeries  . Arthritis   . Dementia     Past Surgical History  Procedure Laterality Date  . Cataract extraction, bilateral Bilateral ~ 2012  . Dental implants    . Femur im nail Left 02/25/2013    Procedure: INTRAMEDULLARY (IM) NAIL FEMORAL;  Surgeon: Johnn Hai, MD;  Location: [facility];  Service: Orthopedics;  Laterality: Left;  . Retinal detachment repair w/ scleral buckle le Left 05/25/2013  . Scleral buckle Left 05/25/2013    Procedure: SCLERAL BUCKLE;  Surgeon: Hayden Pedro, MD;  Location: [facility];  Service: Ophthalmology;  Laterality: Left;  . Photocoagulation with laser Left 05/25/2013    Procedure: HEADSCOPE LASER;  Surgeon: Hayden Pedro, MD;  Location: [facility];  Service: Ophthalmology;  Laterality: Left;  . Gas insertion Left 05/25/2013    Procedure: INSERTION OF GAS;  Surgeon: Hayden Pedro, MD;  Location: [facility];  Service: Ophthalmology;  Laterality: Left;  [id number]  . Pars plana repair of retinal deatachment Left 06/29/2013    Pars plana vitrectomy, laser, PFO injection and removal,  Silicone Oil injection Archie Endo 06/29/2013  . 25 gauge pars plana vitrectomy with 20 gauge mvr port Left 06/29/2013    Procedure: 25 GAUGE PARS PLANA VITRECTOMY WITH 20 GAUGE MVR PORT; Membrame peel; Gas exchange; Laser treatment; Silicone oil injection; Perfluoron injection;  Surgeon: Hayden Pedro, MD;  Location: [facility];  Service:  Ophthalmology;  Laterality: Left;    Family History  Problem Relation Age of Onset  . Heart disease Mother     VALVE REPLACEMENT  . Lung cancer Father     Social History History  Substance Use Topics  . Smoking status: Light Tobacco Smoker -- 0.20 packs/day for 53 years    Types: Cigarettes  . Smokeless tobacco: Never Used     Comment: 7/22//2014 "half a pack of cigarettes a week"  . Alcohol Use: 1.8 oz/week    3 Glasses of wine per week    Allergies  Allergen Reactions  . Penicillins Swelling    Throat swelling Throat swelling    Current Outpatient Prescriptions  Medication Sig Dispense Refill  . amLODipine (NORVASC) 5 MG tablet Take 1 tablet (5 mg total) by mouth daily. 30 tablet 0  . donepezil (ARICEPT) 5 MG tablet Take 5 mg by mouth at bedtime.  12  . metoprolol tartrate (LOPRESSOR) 25 MG tablet Take 1 tablet (25 mg total) by mouth 2 (two) times daily. 60 tablet 0  . Propylene Glycol 0.6 % SOLN Apply to eye.    . methylPREDNISolone (MEDROL DOSEPAK) 4 MG TBPK tablet Use as instructed 21 tablet 0   No current facility-administered medications for this visit.    Review of Systems  Constitutional: positive for anorexia, fatigue and weight loss Eyes: negative Ears, nose, mouth, throat, and face: negative Respiratory: positive for dyspnea on exertion Cardiovascular: negative Gastrointestinal: negative Genitourinary:negative Integument/breast: negative Hematologic/lymphatic: negative Musculoskeletal:positive for back pain Neurological: positive for weakness Behavioral/Psych: positive for excessive alcohol consumption Endocrine: negative Allergic/Immunologic: negative  Physical Exam  XVQ:MGQQP, healthy, no distress, ill looking and malnourished SKIN: skin color, texture, turgor are normal, no rashes or significant lesions HEAD: Normocephalic, No masses, lesions, tenderness or abnormalities EYES: normal, PERRLA, Conjunctiva are pink and non-injected EARS:  External ears normal, Canals clear OROPHARYNX:no exudate, no erythema and lips, buccal mucosa, and tongue normal  NECK: supple, no adenopathy, no JVD LYMPH:  no palpable lymphadenopathy, no hepatosplenomegaly LUNGS: clear to auscultation , and palpation HEART: regular rate & rhythm, no murmurs and no gallops ABDOMEN:abdomen soft, non-tender, normal bowel sounds and no masses or organomegaly BACK: Back symmetric, no curvature., No CVA tenderness EXTREMITIES:no joint deformities, effusion, or inflammation, no edema, no skin discoloration  NEURO: alert & oriented x 3 with fluent speech, no focal motor/sensory deficits  PERFORMANCE STATUS: ECOG 1-2  LABORATORY DATA: Lab Results  Component Value Date   WBC 11.2* 06/29/2013   HGB 12.9* 06/29/2013   HCT 37.2* 06/29/2013   MCV 98.9 06/29/2013   PLT 279 06/29/2013      Chemistry      Component Value Date/Time   NA 137 06/29/2013 1001   K 4.4 06/29/2013 1001   CL 104 06/29/2013 1001   CO2 27 06/29/2013 1001   BUN 14 06/29/2013 1001   CREATININE 0.73 06/29/2013 1001      Component Value Date/Time   CALCIUM 9.6 06/29/2013 1001  ALKPHOS 119* 05/25/2013 0926   AST 12 05/25/2013 0926   ALT 10 05/25/2013 0926   BILITOT 0.4 05/25/2013 0926       RADIOGRAPHIC STUDIES: Ct Head Wo Contrast  05/31/2015   CLINICAL DATA:  74 year old male with fall today. Worsening dementia and multiple recent falls. Confusion. Initial encounter.  EXAM: CT HEAD WITHOUT CONTRAST  TECHNIQUE: Contiguous axial images were obtained from the base of the skull through the vertex without intravenous contrast.  COMPARISON:  02/25/2013.  FINDINGS: Chronic ethmoid sinus mucosal thickening. Other Visualized paranasal sinuses and mastoids are clear. Interval postoperative changes to the left globe. No acute orbit or scalp soft tissue findings. No scalp hematoma.  No acute osseous abnormality identified. Calcified atherosclerosis at the skull base.  Stable cerebral volume.  No midline shift, mass effect, or evidence of intracranial mass lesion. Stable ventricle size and configuration. Chronic Patchy and confluent cerebral white matter hypodensity. No evidence of cortically based acute infarction identified. No acute intracranial hemorrhage identified. No suspicious intracranial vascular hyperdensity.  IMPRESSION: 1. No acute intracranial abnormality. No acute traumatic injury identified. 2. Chronic cerebral white matter changes, most commonly due to chronic small vessel disease.   Electronically Signed   By: Genevie Ann M.D.   On: 05/31/2015 16:07   Ct Chest W Contrast  05/31/2015   CLINICAL DATA:  74 year old male with 25 lb weight loss, former smoker, abnormal left apex on chest x-rays. Subsequent encounter.  EXAM: CT CHEST WITH CONTRAST  TECHNIQUE: Multidetector CT imaging of the chest was performed during intravenous contrast administration.  CONTRAST:  51m ISOVUE-300 IOPAMIDOL (ISOVUE-300) INJECTION 61%  COMPARISON:  Chest radiographs 04/21/2015.  FINDINGS: There is lobular abnormal soft tissue with heterogeneous hyper enhancement occupying the left lung apex corresponding to the abnormal area on the May radiographs. This is masslike and there is erosion of the posterior left second and third rib associated. The abnormal soft tissue extends toward the left second neural foramen. No intraspinal involvement. No thoracic vertebral body erosion or destruction. The abnormal soft tissue tracks medially along the left paraspinal soft tissues nearly to the level of the aortic arch (series 3, image 15 and coronal images 70 and 74). The tumor is crescent-shaped, but all told, encompasses an area of 66 x 68 x 56 mm (AP by transverse by CC). Adjacent septal and nodular interstitial thickening in the left lung apex suspicious for lymphangitic spread of carcinoma (series 4, image 17).  No thoracic inlet or prevascular lymphadenopathy. Negative thyroid. Patent great vessels, calcified  atherosclerosis of the aorta and its branches, especially the left subclavian artery origin.  There is superimposed abnormal anterior mediastinal soft tissue with homogeneous intermediate density (38 Hounsfield units) extending from the innominate vein inferiorly along the anterior mediastinum toward the base of the aorta. This encompasses 24 x 26 x 73 mm (AP by transverse by CC). This does not resemble typical metastatic lymphadenopathy. There is mild left hilar lymph node enlargement, 8 mm (series 3, image 32). Aside from these lesions, no other evidence of mediastinal nodal disease. No pericardial effusion.  There are metastatic pleural implants in the left chest associated with a small layering left pleural effusion (series 3, image 37). There are also superimposed multiple round pulmonary nodules in the left upper lobe lower lobes ranging 4 mm up to 19 mm diameter.  Underlying centrilobular emphysema. There are 2 nonspecific 2-3 mm lung nodules in the right lower lobe (series 4, images 32 and 48). No other acute right lung finding.  No right pleural effusion.  Left greater than right adrenal metastases, 4.5 cm on the left and 1.8 cm on the right. Heterogeneously hypo enhancing central liver mass measuring 3.5 cm also is most compatible with metastasis. There are small are liver dome lesions also likely metastases ranging from 5 mm to 21 mm.  Possible 9 mm superior splenic pole metastasis on series 3, image 52). Nearby nonspecific 3-4 mm soft tissue nodules just under the left hemidiaphragm (series 3, image 57)  Negative visualized kidneys and bowel in the upper abdomen.  Osteopenia. Aside from the left upper rib involvement described earlier, no definite additional osseous metastatic disease is identified. No acute pathologic fracture is identified.  IMPRESSION: 1. 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 Salient findings discussed by telephone with Dr. Kelton Pillar on 05/31/2015 at 1618  hours. 2. Pancoast type left apical lung tumor with pleural implants, paraspinal soft tissue invasion, left second and third rib erosion, left lung pulmonary metastases, adrenal metastases, liver metastases, and possible splenic metastasis. 3. There is also a more homogeneous appearing and elongated (7 cm) soft tissue mass in the anterior mediastinum which I would favor to be unrelated and thymic in origin. 4. Otherwise paucity of mediastinal/hilar node involvement. Mild enlargement of a left hilar node up to 8 mm is suspicious for early nodal disease. 5. Small layering left pleural effusion.   Electronically Signed   By: Genevie Ann M.D.   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   PLAN: I had a lengthy discussion with the patient and his wife today about his current condition and further investigation to confirm a diagnosis as well as completing the staging workup.  I recommended for the patient to have a PET scan performed to complete the staging workup. I will also consider him for MRI of the brain to rule out brain metastasis since his last CT scan of the head was done without contrast.  I will also arrange for the patient to have ultrasound-guided core biopsy of the liver by interventional radiology for tissue diagnosis.  For the lack of appetite and weight loss, I will start the patient on Medrol Dosepak. I also referred him to the dietitian at the [facility] for evaluation and management of his malnutrition.  I would see the patient back for follow-up visit in 2 weeks for reevaluation and more detailed discussion of his treatment options based on the imaging and biopsy results.  He was advised to call immediately if he has any concerning symptoms in the interval. The patient voices understanding of current  disease status and treatment options and is in agreement with the current care plan.  All questions were answered. The patient knows to call the clinic with any problems, questions or concerns. We can certainly see the patient much sooner if necessary.  Thank you so much for allowing me to participate in the care of Timothy Pena. I will continue to follow up the patient with you and assist in his care.  I spent 40 minutes counseling the patient face to face. The total time spent in the appointment was 60 minutes.  Disclaimer: This note was dictated with voice recognition software. Similar sounding words can inadvertently be transcribed and may not be corrected upon review.   Avonda Toso K.  June 07, 2015, 2:14 PM

## 2015-06-14 ENCOUNTER — Ambulatory Visit (INDEPENDENT_AMBULATORY_CARE_PROVIDER_SITE_OTHER): Payer: Medicare Other | Admitting: Ophthalmology

## 2015-06-15 ENCOUNTER — Other Ambulatory Visit: Payer: Self-pay | Admitting: Radiology

## 2015-06-15 ENCOUNTER — Ambulatory Visit (HOSPITAL_COMMUNITY)
Admission: RE | Admit: 2015-06-15 | Discharge: 2015-06-15 | Disposition: A | Discharge status: Home / Self Care | Payer: Medicare Other | Source: Ambulatory Visit | Attending: Internal Medicine | Admitting: Internal Medicine

## 2015-06-15 DIAGNOSIS — R918 Other nonspecific abnormal finding of lung field: Secondary | ICD-10-CM

## 2015-06-15 DIAGNOSIS — G319 Degenerative disease of nervous system, unspecified: Secondary | ICD-10-CM | POA: Diagnosis not present

## 2015-06-15 DIAGNOSIS — I6782 Cerebral ischemia: Secondary | ICD-10-CM | POA: Insufficient documentation

## 2015-06-15 DIAGNOSIS — R410 Disorientation, unspecified: Secondary | ICD-10-CM | POA: Diagnosis present

## 2015-06-15 LAB — CREATININE, SERUM
Creatinine, Ser: 0.9 mg/dL (ref 0.61–1.24)
GFR calc Af Amer: >60 mL/min (ref >60)
GFR calc non Af Amer: >60 mL/min (ref >60)

## 2015-06-15 MED ORDER — GADOBENATE DIMEGLUMINE 529 MG/ML IV SOLN
10.0000 mL | Freq: Once | INTRAVENOUS | Status: AC | PRN
  Administered 2015-06-15: 10 mL via INTRAVENOUS

## 2015-06-16 ENCOUNTER — Other Ambulatory Visit: Payer: Self-pay | Admitting: Radiology

## 2015-06-16 ENCOUNTER — Encounter (HOSPITAL_COMMUNITY)
Admission: RE | Admit: 2015-06-16 | Discharge: 2015-06-16 | Disposition: A | Discharge status: Home / Self Care | Payer: Medicare Other | Source: Ambulatory Visit | Attending: Internal Medicine | Admitting: Internal Medicine

## 2015-06-16 DIAGNOSIS — R918 Other nonspecific abnormal finding of lung field: Secondary | ICD-10-CM | POA: Diagnosis not present

## 2015-06-16 LAB — GLUCOSE, CAPILLARY: GLUCOSE-CAPILLARY: 111 mg/dL — AB (ref 65–99)

## 2015-06-16 MED ORDER — FLUDEOXYGLUCOSE F - 18 (FDG) INJECTION
7.7000 | Freq: Once | INTRAVENOUS | Status: AC | PRN
  Administered 2015-06-16: 7.7 via INTRAVENOUS

## 2015-06-19 ENCOUNTER — Ambulatory Visit (HOSPITAL_COMMUNITY)
Admission: RE | Admit: 2015-06-19 | Discharge: 2015-06-19 | Disposition: A | Discharge status: Home / Self Care | Payer: Medicare Other | Source: Ambulatory Visit | Attending: Internal Medicine | Admitting: Internal Medicine

## 2015-06-19 ENCOUNTER — Encounter (HOSPITAL_COMMUNITY): Payer: Self-pay

## 2015-06-19 ENCOUNTER — Other Ambulatory Visit: Payer: Self-pay | Admitting: Internal Medicine

## 2015-06-19 DIAGNOSIS — R222 Localized swelling, mass and lump, trunk: Secondary | ICD-10-CM | POA: Diagnosis not present

## 2015-06-19 DIAGNOSIS — R918 Other nonspecific abnormal finding of lung field: Secondary | ICD-10-CM

## 2015-06-19 LAB — CBC
HEMATOCRIT: 31.7 % — AB (ref 39.0–52.0)
HEMOGLOBIN: 10.4 g/dL — AB (ref 13.0–17.0)
MCH: 31.7 pg (ref 26.0–34.0)
MCHC: 32.8 g/dL (ref 30.0–36.0)
MCV: 96.6 fL (ref 78.0–100.0)
Platelets: 341 10*3/uL (ref 150–400)
RBC: 3.28 MIL/uL — ABNORMAL LOW (ref 4.22–5.81)
RDW: 13.7 % (ref 11.5–15.5)
WBC: 11.9 10*3/uL — AB (ref 4.0–10.5)

## 2015-06-19 LAB — PROTIME-INR
INR: 1.05 (ref 0.00–1.49)
PROTHROMBIN TIME: 13.9 s (ref 11.6–15.2)

## 2015-06-19 LAB — APTT: APTT: 30 s (ref 24–37)

## 2015-06-19 MED ORDER — FENTANYL CITRATE (PF) 100 MCG/2ML IJ SOLN
INTRAMUSCULAR | Status: AC
  Filled 2015-06-19: qty 4

## 2015-06-19 MED ORDER — MIDAZOLAM HCL 2 MG/2ML IJ SOLN
INTRAMUSCULAR | Status: AC
  Filled 2015-06-19: qty 6

## 2015-06-19 MED ORDER — SODIUM CHLORIDE 0.9 % IV SOLN: Freq: Once | INTRAVENOUS | Status: DC

## 2015-06-19 MED ORDER — MIDAZOLAM HCL 2 MG/2ML IJ SOLN
INTRAMUSCULAR | Status: AC | PRN
  Administered 2015-06-19: 1 mg via INTRAVENOUS

## 2015-06-19 MED ORDER — HYDROCODONE-ACETAMINOPHEN 5-325 MG PO TABS
1.0000 | ORAL_TABLET | ORAL | Status: DC | PRN
  Filled 2015-06-19: qty 2

## 2015-06-19 MED ORDER — FENTANYL CITRATE (PF) 100 MCG/2ML IJ SOLN
INTRAMUSCULAR | Status: AC | PRN
  Administered 2015-06-19: 50 ug via INTRAVENOUS

## 2015-06-19 NOTE — Procedures (Signed)
US liver lesion core bx 18g x3 to surg path No complication No blood loss. See complete dictation in [facility].

## 2015-06-19 NOTE — Discharge Instructions (Signed)
Liver Biopsy, Care After °Refer to this sheet in the next few weeks. These instructions provide you with information on caring for yourself after your procedure. Your health care provider may also give you more specific instructions. Your treatment has been planned according to current medical practices, but problems sometimes occur. Call your health care provider if you have any problems or questions after your procedure. °WHAT TO EXPECT AFTER THE PROCEDURE °After your procedure, it is typical to have the following: °· A small amount of discomfort in the area where the biopsy was done and in the right shoulder or shoulder blade. °· A small amount of bruising around the area where the biopsy was done and on the skin over the liver. °· Sleepiness and fatigue for the rest of the day. °HOME CARE INSTRUCTIONS  °· Rest at home for 1-2 days or as directed by your health care provider. °· Have a friend or family member stay with you for at least 24 hours. °· Because of the medicines used during the procedure, you should not do the following things in the first 24 hours: °¨ Drive. °¨ Use machinery. °¨ Be responsible for the care of other people. °¨ Sign legal documents. °¨ Take a bath or shower. °· There are many different ways to close and cover an incision, including stitches, skin glue, and adhesive strips. Follow your health care provider's instructions on: °¨ Incision care. °¨ Bandage (dressing) changes and removal. °¨ Incision closure removal. °· Do not drink alcohol in the first week. °· Do not lift more than 5 pounds or play contact sports for 2 weeks after this test. °· Take medicines only as directed by your health care provider. Do not take medicine containing aspirin or non-steroidal anti-inflammatory medicines such as ibuprofen for 1 week after this test. °· It is your responsibility to get your test results. °SEEK MEDICAL CARE IF:  °· You have increased bleeding from an incision that results in more than a  small spot of blood. °· You have redness, swelling, or increasing pain in any incisions. °· You notice a discharge or a bad smell coming from any of your incisions. °· You have a fever or chills. °SEEK IMMEDIATE MEDICAL CARE IF:  °· You develop swelling, bloating, or pain in your abdomen. °· You become dizzy or faint. °· You develop a rash. °· You are nauseous or vomit. °· You have difficulty breathing, feel short of breath, or feel faint. °· You develop chest pain. °· You have problems with your speech or vision. °· You have trouble balancing or moving your arms or legs. °Document Released: 06/14/2005 Document Revised: 04/11/2014 Document Reviewed: 01/21/2014 °[employer]® Patient Information ©2015 [employer]. This information is not intended to replace advice given to you by your health care provider. Make sure you discuss any questions you have with your health care provider. °Conscious Sedation, Adult, Care After °Refer to this sheet in the next few weeks. These instructions provide you with information on caring for yourself after your procedure. Your health care provider may also give you more specific instructions. Your treatment has been planned according to current medical practices, but problems sometimes occur. Call your health care provider if you have any problems or questions after your procedure. °WHAT TO EXPECT AFTER THE PROCEDURE  °After your procedure: °· You may feel sleepy, clumsy, and have poor balance for several hours. °· Vomiting may occur if you eat too soon after the procedure. °HOME CARE INSTRUCTIONS °· Do not participate   in any activities where you could become injured for at least 24 hours. Do not: °¨ Drive. °¨ Swim. °¨ Ride a bicycle. °¨ Operate heavy machinery. °¨ Cook. °¨ Use power tools. °¨ Climb ladders. °¨ Work from a high place. °· Do not make important decisions or sign legal documents until you are improved. °· If you vomit, drink water, juice, or soup when you can drink without  vomiting. Make sure you have little or no nausea before eating solid foods. °· Only take over-the-counter or prescription medicines for pain, discomfort, or fever as directed by your health care provider. °· Make sure you and your family fully understand everything about the medicines given to you, including what side effects may occur. °· You should not drink alcohol, take sleeping pills, or take medicines that cause drowsiness for at least 24 hours. °· If you smoke, do not smoke without supervision. °· If you are feeling better, you may resume normal activities 24 hours after you were sedated. °· Keep all appointments with your health care provider. °SEEK MEDICAL CARE IF: °· Your skin is pale or bluish in color. °· You continue to feel nauseous or vomit. °· Your pain is getting worse and is not helped by medicine. °· You have bleeding or swelling. °· You are still sleepy or feeling clumsy after 24 hours. °SEEK IMMEDIATE MEDICAL CARE IF: °· You develop a rash. °· You have difficulty breathing. °· You develop any type of allergic problem. °· You have a fever. °MAKE SURE YOU: °· Understand these instructions. °· Will watch your condition. °· Will get help right away if you are not doing well or get worse. °Document Released: 09/15/2013 Document Reviewed: 09/15/2013 °Exit[employer]® Patient Information ©2015 [employer]. This information is not intended to replace advice given to you by your health care provider. Make sure you discuss any questions you have with your health care provider. ° °

## 2015-06-19 NOTE — H&P (Signed)
Chief Complaint: "I'm here for a liver biopsy"  Referring Physician(s): Mohamed,Mohamed  History of Present Illness: Timothy Pena is a 74 y.o. 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 He has no prior history of malignancy and presents today for ultrasound-guided liver lesion biopsy for further evaluation.  Past Medical History  Diagnosis Date  . Hypertension   . Seasonal allergies   . Complication of anesthesia     Constipation  . Anxiety     Due to surgeries  . Arthritis   . Dementia     Past Surgical History  Procedure Laterality Date  . Cataract extraction, bilateral Bilateral ~ 2012  . Dental implants    . Femur im nail Left 02/25/2013    Procedure: INTRAMEDULLARY (IM) NAIL FEMORAL;  Surgeon: Johnn Hai, MD;  Location: [facility];  Service: Orthopedics;  Laterality: Left;  . Retinal detachment repair w/ scleral buckle le Left 05/25/2013  . Scleral buckle Left 05/25/2013    Procedure: SCLERAL BUCKLE;  Surgeon: Hayden Pedro, MD;  Location: [facility];  Service: Ophthalmology;  Laterality: Left;  . Photocoagulation with laser Left 05/25/2013    Procedure: HEADSCOPE LASER;  Surgeon: Hayden Pedro, MD;  Location: [place];  Service: Ophthalmology;  Laterality: Left;  . Gas insertion Left 05/25/2013    Procedure: INSERTION OF GAS;  Surgeon: Hayden Pedro, MD;  Location: [place];  Service: Ophthalmology;  Laterality: Left;  [id number]  . Pars plana repair of retinal deatachment Left 06/29/2013    Pars plana vitrectomy, laser, PFO injection and removal,  Silicone Oil injection Archie Endo 06/29/2013  . 25 gauge pars plana vitrectomy with 20 gauge mvr port Left 06/29/2013     Procedure: 25 GAUGE PARS PLANA VITRECTOMY WITH 20 GAUGE MVR PORT; Membrame peel; Gas exchange; Laser treatment; Silicone oil injection; Perfluoron injection;  Surgeon: Hayden Pedro, MD;  Location: [place];  Service: Ophthalmology;  Laterality: Left;    Allergies: Penicillins  Medications: Prior to Admission medications   Medication Sig Start Date End Date Taking? Authorizing Provider  amLODipine (NORVASC) 5 MG tablet Take 1 tablet (5 mg total) by mouth daily. 03/01/13   Belkys A Regalado, MD  donepezil (ARICEPT) 5 MG tablet Take 5 mg by mouth at bedtime. 04/21/15   Historical Provider, MD  methylPREDNISolone (MEDROL DOSEPAK) 4 MG TBPK tablet Use as instructed 06/07/15   Curt Bears, MD  metoprolol tartrate (LOPRESSOR) 25 MG tablet Take 1 tablet (25 mg total) by mouth 2 (two) times daily. 03/01/13   Belkys A Regalado, MD  Propylene Glycol 0.6 % SOLN Apply to eye.    Historical Provider, MD     Family History  Problem Relation Age of Onset  . Heart disease Mother     VALVE REPLACEMENT  . Lung cancer Father     History   Social History  . Marital Status: Married    Spouse Name: N/A  . Number of Children: N/A  . Years of Education: N/A   Social History Main Topics  . Smoking status: Light Tobacco Smoker -- 0.20 packs/day for 53 years    Types: Cigarettes  . Smokeless tobacco: Never Used     Comment: 7/22//2014 "half a pack of cigarettes a  week"  . Alcohol Use: 1.8 oz/week    3 Glasses of wine per week  . Drug Use: No  . Sexual Activity: Not Currently   Other Topics Concern  . None   Social History Narrative      Review of Systems  Constitutional: Positive for activity change, appetite change, fatigue and unexpected weight change. Negative for fever and chills.  Respiratory: Positive for shortness of breath. Negative for cough.   Cardiovascular: Negative for chest pain.  Gastrointestinal: Negative for nausea, vomiting, abdominal pain and blood in stool.    Genitourinary: Negative for dysuria and hematuria.  Musculoskeletal: Positive for back pain.  Neurological: Negative for headaches.  Hematological: Does not bruise/bleed easily.    Vital Signs: BP 91/65 mmHg  Temp(Src) 97.8 F (36.6 C) (Oral)  Resp 16  SpO2 98%  Physical Exam  Constitutional: He is oriented to person, place, and time.  Cachectic appearing white male in no acute distress  Neck:  Left supraclavicular adenopathy  Cardiovascular: Normal rate and regular rhythm.   Pulmonary/Chest: Effort normal.  Subcutaneous soft tissue nodules upper chest wall regions. Axillary adenopathy; sl dim BS left base, right clear  Abdominal: Soft. Bowel sounds are normal.  Left abdominal subcutaneous nodule, mildly tender to palpation  Musculoskeletal: Normal range of motion. He exhibits no edema.  Neurological: He is alert and oriented to person, place, and time.    Mallampati Score:     Imaging: Ct Head Wo Contrast  05/31/2015   CLINICAL DATA:  74 year old male with fall today. Worsening dementia and multiple recent falls. Confusion. Initial encounter.  EXAM: CT HEAD WITHOUT CONTRAST  TECHNIQUE: Contiguous axial images were obtained from the base of the skull through the vertex without intravenous contrast.  COMPARISON:  02/25/2013.  FINDINGS: Chronic ethmoid sinus mucosal thickening. Other Visualized paranasal sinuses and mastoids are clear. Interval postoperative changes to the left globe. No acute orbit or scalp soft tissue findings. No scalp hematoma.  No acute osseous abnormality identified. Calcified atherosclerosis at the skull base.  Stable cerebral volume. No midline shift, mass effect, or evidence of intracranial mass lesion. Stable ventricle size and configuration. Chronic Patchy and confluent cerebral white matter hypodensity. No evidence of cortically based acute infarction identified. No acute intracranial hemorrhage identified. No suspicious intracranial vascular  hyperdensity.  IMPRESSION: 1. No acute intracranial abnormality. No acute traumatic injury identified. 2. Chronic cerebral white matter changes, most commonly due to chronic small vessel disease.   Electronically Signed   By: Genevie Ann M.D.   On: 05/31/2015 16:07   Ct Chest W Contrast  05/31/2015   CLINICAL DATA:  74 year old male with 25 lb weight loss, former smoker, abnormal left apex on chest x-rays. Subsequent encounter.  EXAM: CT CHEST WITH CONTRAST  TECHNIQUE: Multidetector CT imaging of the chest was performed during intravenous contrast administration.  CONTRAST:  44m ISOVUE-300 IOPAMIDOL (ISOVUE-300) INJECTION 61%  COMPARISON:  Chest radiographs 04/21/2015.  FINDINGS: There is lobular abnormal soft tissue with heterogeneous hyper enhancement occupying the left lung apex corresponding to the abnormal area on the May radiographs. This is masslike and there is erosion of the posterior left second and third rib associated. The abnormal soft tissue extends toward the left second neural foramen. No intraspinal involvement. No thoracic vertebral body erosion or destruction. The abnormal soft tissue tracks medially along the left paraspinal soft tissues nearly to the level of the aortic arch (series 3, image 15 and coronal images 70 and 74). The tumor is crescent-shaped, but all  told, encompasses an area of 66 x 68 x 56 mm (AP by transverse by CC). Adjacent septal and nodular interstitial thickening in the left lung apex suspicious for lymphangitic spread of carcinoma (series 4, image 17).  No thoracic inlet or prevascular lymphadenopathy. Negative thyroid. Patent great vessels, calcified atherosclerosis of the aorta and its branches, especially the left subclavian artery origin.  There is superimposed abnormal anterior mediastinal soft tissue with homogeneous intermediate density (38 Hounsfield units) extending from the innominate vein inferiorly along the anterior mediastinum toward the base of the aorta. This  encompasses 24 x 26 x 73 mm (AP by transverse by CC). This does not resemble typical metastatic lymphadenopathy. There is mild left hilar lymph node enlargement, 8 mm (series 3, image 32). Aside from these lesions, no other evidence of mediastinal nodal disease. No pericardial effusion.  There are metastatic pleural implants in the left chest associated with a small layering left pleural effusion (series 3, image 37). There are also superimposed multiple round pulmonary nodules in the left upper lobe lower lobes ranging 4 mm up to 19 mm diameter.  Underlying centrilobular emphysema. There are 2 nonspecific 2-3 mm lung nodules in the right lower lobe (series 4, images 32 and 48). No other acute right lung finding. No right pleural effusion.  Left greater than right adrenal metastases, 4.5 cm on the left and 1.8 cm on the right. Heterogeneously hypo enhancing central liver mass measuring 3.5 cm also is most compatible with metastasis. There are small are liver dome lesions also likely metastases ranging from 5 mm to 21 mm.  Possible 9 mm superior splenic pole metastasis on series 3, image 52). Nearby nonspecific 3-4 mm soft tissue nodules just under the left hemidiaphragm (series 3, image 57)  Negative visualized kidneys and bowel in the upper abdomen.  Osteopenia. Aside from the left upper rib involvement described earlier, no definite additional osseous metastatic disease is identified. No acute pathologic fracture is identified.  IMPRESSION: 1. 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 Salient findings discussed by telephone with Dr. Kelton Pillar on 05/31/2015 at 1618 hours. 2. Pancoast type left apical lung tumor with pleural implants, paraspinal soft tissue invasion, left second and third rib erosion, left lung pulmonary metastases, adrenal metastases, liver metastases, and possible splenic metastasis. 3. There is also a more homogeneous appearing and elongated (7 cm) soft tissue mass in the  anterior mediastinum which I would favor to be unrelated and thymic in origin. 4. Otherwise paucity of mediastinal/hilar node involvement. Mild enlargement of a left hilar node up to 8 mm is suspicious for early nodal disease. 5. Small layering left pleural effusion.   Electronically Signed   By: Genevie Ann M.D.   On: 05/31/2015 16:31   Mr Jeri Cos UE Contrast  06/15/2015   CLINICAL DATA:  Left apical lung mass. Cancer staging. Confusion, difficulty walking, and numbness and weakness in the arms and legs. Blurry vision.  EXAM: MRI HEAD WITHOUT AND WITH CONTRAST  TECHNIQUE: Multiplanar, multiecho pulse sequences of the brain and surrounding structures were obtained without and with intravenous contrast.  CONTRAST:  16m MULTIHANCE GADOBENATE DIMEGLUMINE 529 MG/ML IV SOLN  COMPARISON:  Head CT 05/31/2015  FINDINGS: There is no evidence of acute infarct, intracranial hemorrhage, mass, midline shift, or extra-axial fluid collection. There is moderate cerebral atrophy. Patchy and confluent T2 hyperintensities throughout the subcortical and deep cerebral white matter bilaterally are nonspecific but compatible with moderate chronic small vessel ischemic disease. A small  developmental venous anomaly is incidentally noted in the right cerebellum. No abnormal enhancement is identified elsewhere.  Prior bilateral cataract extraction and left scleral banding are noted. There is mild right ethmoid air cell mucosal thickening and a right maxillary sinus mucous retention cyst. Mastoid air cells are clear. Major intracranial vascular flow voids are preserved. No destructive osseous lesions are identified.  IMPRESSION: 1. No evidence of intracranial metastases. 2. Moderate chronic small vessel ischemic disease and cerebral atrophy.   Electronically Signed   By: Logan Bores   On: 06/15/2015 15:36   Nm Pet Image Initial (pi) Skull Base To Thigh  06/16/2015   CLINICAL DATA:  Initial treatment strategy for left apical Pancoast tumor  with pleural implants, os paraspinal soft tissue invasion, rib erosions, and suspected metastatic disease to the left lung, adrenal glands, liver, and possibly spleen.  EXAM: NUCLEAR MEDICINE PET SKULL BASE TO THIGH  TECHNIQUE: 7.3 mCi F-18 FDG was injected intravenously. Full-ring PET imaging was performed from the skull base to thigh after the radiotracer. CT data was obtained and used for attenuation correction and anatomic localization.  FASTING BLOOD GLUCOSE:  Value: 111 mg/dl  COMPARISON:  Multiple exams, including 05/31/2015  FINDINGS: NECK  In the vicinity of the left rotatores cervicis muscle at about the C4 level, there is day focus of hypermetabolic activity with maximum SUV 3.8.  In the vicinity of the left C7-T1 neural foramen and adjacent lateral extraforaminal space.  A left lateral supraclavicular lymph node measuring 8 mm in short axis on image 45 series 4 has maximum standard uptake value 4.5. There is an abnormal focus of hypermetabolic activity measuring 6.7.  CHEST  The left apical Pancoast tumor invading the chest wall and the top left 3 ribs is highly hypermetabolic and may extend into the left T2-3 neural foraminal region, maximum standard uptake value 16.0. Tumor related to this mass extends in the left pleural space, causing a pleural rind. Bilateral hypermetabolic axillary lymph nodes are present along with small hypermetabolic mediastinal lymph nodes, potential metastatic lesions in the atelectatic left lower lobe, and left hilar and infrahilar nodes. There are hypermetabolic lesions in the right and left chest wall/subcutaneous tissues, as well as osseous metastatic lesions involving the ribs, upper thoracic vertebra, and left scapular spine. An index right axillary lymph node measures 1 cm in short axis on image 55 series 4 and has maximum standard uptake value of 13.7.  ABDOMEN/PELVIS  Extensive metastatic burden of the abdomen including bilateral adrenal metastatic lesions, scattered  hepatic metastatic lesions, hypermetabolic peripancreatic adenopathy, a left upper pole renal capsular/ carry renal metastatic lesion, a right perirenal space hypermetabolic lesion above the right kidney, left upper quadrant mesenteric metastatic lesions; subcutaneous metastatic lesions along the anterior abdomen and left posterior buttock, as well as musculoskeletal metastatic lesions.  As an index lesion, the left adrenal mass measures 4 point far by 2.7 cm on image 107 of series 4 and has maximum standard uptake value 9.8. The dominant segment 4 mass in the liver measures 5.9 by 4.5 cm on image 109 of series 4 and has maximum standard uptake value 10.0. The left carry renal metastatic focus has maximum standard uptake value of 7.4. The left lower quadrant anterior abdominal wall subcutaneous mass just below the level of the iliac crests measures 2.1 by 1.4 cm on image 142 of series 4 and has maximum standard uptake value of 8.9.  No definite splenic lesion.  SKELETON  Osseous metastatic lesions involving the ribs, left  scapula, various vertebra, bony pelvis, and right psoas muscle. Among these, some of the more significant lesions include a left eccentric T10 posterior vertebral body lesion which could have an epidural component, maximum standard uptake value 16.9; a destructive left posterior acetabular wall mass which may be eroding into the joint and is causing significant breakdown of the posterior wall with extraosseous spread, maximum standard uptake value 13.5 ; and a right iloipsoas metastatic lesion measuring with maximum standard uptake value 7.6.  IMPRESSION: 1. Left Pancoast tumor with extensive metastatic disease to the skeleton, thoracoabdominal subcutaneous tissues, muscular tissues, left pleural surface, both adrenal glands, liver, left upper quadrant mesentery, and perirenal spaces. 2. Some of the lesions involve the spine and some are along the neural foramina/epidural margin in the cervical and  thoracic spine.   Electronically Signed   By: Van Clines M.D.   On: 06/16/2015 16:09    Labs:  CBC: No results for input(s): WBC, HGB, HCT, PLT in the last 8760 hours.  COAGS: No results for input(s): INR, APTT in the last 8760 hours.  BMP:  Recent Labs  06/15/15 1240  CREATININE 0.90  GFRNONAA >60  GFRAA >60    LIVER FUNCTION TESTS: No results for input(s): BILITOT, AST, ALT, ALKPHOS, PROT, ALBUMIN in the last 8760 hours.  TUMOR MARKERS: No results for input(s): AFPTM, CEA, CA199, CHROMGRNA in the last 8760 hours.  Assessment and Plan: Verdun Rackley is a 74 y.o. 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 He has no prior history of malignancy and presents today for ultrasound-guided liver lesion biopsy for further evaluation.Risks and benefits discussed with the patient/wife including, but not limited to bleeding, infection, damage to adjacent structures or low yield requiring additional tests and death. All of the patient's questions were answered, patient is agreeable to proceed.Consent signed and in chart.       Signed: D. Rowe Robert 06/19/2015, 12:14 PM   I spent a total of 30 minutes in face to face in clinical consultation, greater than 50% of which was counseling/coordinating care for US guided liver lesion biopsy

## 2015-06-21 ENCOUNTER — Ambulatory Visit (HOSPITAL_BASED_OUTPATIENT_CLINIC_OR_DEPARTMENT_OTHER): Payer: Medicare Other | Admitting: Physician Assistant

## 2015-06-21 ENCOUNTER — Telehealth: Payer: Self-pay | Admitting: Internal Medicine

## 2015-06-21 ENCOUNTER — Encounter: Payer: Self-pay | Admitting: Physician Assistant

## 2015-06-21 VITALS — BP 91/58 | HR 96 | Temp 97.5°F | Resp 18 | Ht 68.0 in | Wt 107.6 lb

## 2015-06-21 DIAGNOSIS — C800 Disseminated malignant neoplasm, unspecified: Secondary | ICD-10-CM | POA: Diagnosis not present

## 2015-06-21 DIAGNOSIS — C7951 Secondary malignant neoplasm of bone: Secondary | ICD-10-CM

## 2015-06-21 DIAGNOSIS — C3412 Malignant neoplasm of upper lobe, left bronchus or lung: Secondary | ICD-10-CM

## 2015-06-21 DIAGNOSIS — R918 Other nonspecific abnormal finding of lung field: Secondary | ICD-10-CM

## 2015-06-21 NOTE — Progress Notes (Addendum)
No images are attached to the encounter. No scans are attached to the encounter. No scans are attached to the encounter. [facility] SHARED VISIT PROGRESS NOTE  Timothy Casco, MD [address] [place] [address]  DIAGNOSIS: Stage IV non-small cell lung cancer, adenocarcinoma. Presenting with left Pancoast tumor  PRIOR THERAPY: None  CURRENT THERAPY: None  INTERVAL HISTORY: Timothy Pena 74 y.o. male returns for a scheduled regular office visit for followup of his recently diagnosed metastatic lung cancer. He completed his staging workup with MRI of the brain with and without contrast on 06/15/2015 and a PET scan on 06/16/2015 with a core biopsy of the liver for tissue diagnosis on 06/19/2015. He continues to have poor by mouth intake and has lost an additional 4 pounds. He complains of significant pain. He is accompanied today by his estranged wife. He is not happy about the number of pills that he has to take and well he finished the Medrol Dosepak did not exactly take it as prescribed. He presents today to discuss his staging workup results as well as treatment options.  MEDICAL HISTORY: Past Medical History  Diagnosis Date  . Hypertension   . Seasonal allergies   . Complication of anesthesia     Constipation  . Anxiety     Due to surgeries  . Arthritis   . Dementia     ALLERGIES:  is allergic to penicillins.  MEDICATIONS:  Current Outpatient Prescriptions  Medication Sig Dispense Refill  . amLODipine (NORVASC) 5 MG tablet Take 1 tablet (5 mg total) by mouth daily. 30 tablet 0  . donepezil (ARICEPT) 5 MG tablet Take 5 mg by mouth at bedtime.  12  . methylPREDNISolone (MEDROL DOSEPAK) 4 MG TBPK tablet Use as instructed 21 tablet 0  . metoprolol tartrate (LOPRESSOR) 25 MG tablet Take 1 tablet (25 mg total) by mouth 2 (two) times daily. 60 tablet 0  . Propylene Glycol 0.6 % SOLN Apply to eye.     No current facility-administered  medications for this visit.    SURGICAL HISTORY:  Past Surgical History  Procedure Laterality Date  . Cataract extraction, bilateral Bilateral ~ 2012  . Dental implants    . Femur im nail Left 02/25/2013    Procedure: INTRAMEDULLARY (IM) NAIL FEMORAL;  Surgeon: Johnn Hai, MD;  Location: [facility];  Service: Orthopedics;  Laterality: Left;  . Retinal detachment repair w/ scleral buckle le Left 05/25/2013  . Scleral buckle Left 05/25/2013    Procedure: SCLERAL BUCKLE;  Surgeon: Hayden Pedro, MD;  Location: [place];  Service: Ophthalmology;  Laterality: Left;  . Photocoagulation with laser Left 05/25/2013    Procedure: HEADSCOPE LASER;  Surgeon: Hayden Pedro, MD;  Location: [place];  Service: Ophthalmology;  Laterality: Left;  . Gas insertion Left 05/25/2013    Procedure: INSERTION OF GAS;  Surgeon: Hayden Pedro, MD;  Location: [place];  Service: Ophthalmology;  Laterality: Left;  C3F8  . Pars plana repair of retinal deatachment Left 06/29/2013    Pars plana vitrectomy, laser, PFO injection and removal,  Silicone Oil injection Archie Endo 06/29/2013  . 25 gauge pars plana vitrectomy with 20 gauge mvr port Left 06/29/2013    Procedure: 25 GAUGE PARS PLANA VITRECTOMY WITH 20 GAUGE MVR PORT; Membrame peel; Gas exchange; Laser treatment; Silicone oil injection; Perfluoron injection;  Surgeon: Hayden Pedro, MD;  Location: [place];  Service: Ophthalmology;  Laterality: Left;    REVIEW OF SYSTEMS:  Review  of Systems  Constitutional: Positive for weight loss. Negative for fever, chills, malaise/fatigue and diaphoresis.  HENT: Negative for congestion, ear discharge, ear pain, hearing loss, nosebleeds, sore throat and tinnitus.   Eyes: Negative for blurred vision, double vision, photophobia, pain, discharge and redness.  Respiratory: Negative for cough, hemoptysis, sputum production, shortness of breath, wheezing and stridor.   Cardiovascular: Negative for chest pain, palpitations, orthopnea, claudication,  leg swelling and PND.  Gastrointestinal: Positive for abdominal pain. Negative for heartburn, nausea, vomiting, diarrhea, constipation, blood in stool and melena.       Patient has thoracoabdominal subcutaneous tissue metastasis.  Reports episodes of bowel incontinence  Genitourinary: Negative.   Musculoskeletal: Positive for back pain and joint pain.  Skin: Negative.   Neurological: Positive for weakness. Negative for dizziness, tingling, focal weakness, seizures and headaches.  Endo/Heme/Allergies: Does not bruise/bleed easily.  Psychiatric/Behavioral: Negative for depression. The patient is not nervous/anxious and does not have insomnia.      PHYSICAL EXAMINATION: Physical Exam  Constitutional: He is oriented to person, place, and time and well-developed, well-nourished, and in no distress.  Seated in wheelchair in no acute distress but appears uncomfortable. Able to move all extremities independently  HENT:  Head: Normocephalic and atraumatic.  Mouth/Throat: Oropharynx is clear and moist.  Eyes: Pupils are equal, round, and reactive to light.  Neck: Normal range of motion. Neck supple. No JVD present. No tracheal deviation present. No thyromegaly present.  Cardiovascular: Normal rate, regular rhythm, normal heart sounds and intact distal pulses.  Exam reveals no gallop and no friction rub.   No murmur heard. Pulmonary/Chest: Effort normal and breath sounds normal. No respiratory distress. He has no wheezes. He has no rales.  Abdominal: Soft. Bowel sounds are normal. He exhibits no distension and no mass. There is no tenderness.  Musculoskeletal: Normal range of motion. He exhibits tenderness. He exhibits no edema.  Patient has extensive skeletal metastasis  Lymphadenopathy:    He has no cervical adenopathy.  Neurological: He is alert and oriented to person, place, and time. He has normal reflexes. Gait normal.  Skin: Skin is warm and dry. No rash noted.  Subcutaneous masses in the  thoracic and abdominal areas    ECOG PERFORMANCE STATUS: 2 - Symptomatic, <50% confined to bed  Blood pressure 91/58, pulse 96, temperature 97.5 F (36.4 C), temperature source Oral, resp. rate 18, height 5' 8"  (1.727 m), weight 107 lb 9.6 oz (48.807 kg), SpO2 96 %.  LABORATORY DATA: Lab Results  Component Value Date   WBC 11.9* 06/19/2015   HGB 10.4* 06/19/2015   HCT 31.7* 06/19/2015   MCV 96.6 06/19/2015   PLT 341 06/19/2015      Chemistry      Component Value Date/Time   NA 137 06/29/2013 1001   K 4.4 06/29/2013 1001   CL 104 06/29/2013 1001   CO2 27 06/29/2013 1001   BUN 14 06/29/2013 1001   CREATININE 0.90 06/15/2015 1240      Component Value Date/Time   CALCIUM 9.6 06/29/2013 1001   ALKPHOS 119* 05/25/2013 0926   AST 12 05/25/2013 0926   ALT 10 05/25/2013 0926   BILITOT 0.4 05/25/2013 0926       RADIOGRAPHIC STUDIES:  Ct Head Wo Contrast  05/31/2015   CLINICAL DATA:  74 year old male with fall today. Worsening dementia and multiple recent falls. Confusion. Initial encounter.  EXAM: CT HEAD WITHOUT CONTRAST  TECHNIQUE: Contiguous axial images were obtained from the base of the skull through the vertex  without intravenous contrast.  COMPARISON:  02/25/2013.  FINDINGS: Chronic ethmoid sinus mucosal thickening. Other Visualized paranasal sinuses and mastoids are clear. Interval postoperative changes to the left globe. No acute orbit or scalp soft tissue findings. No scalp hematoma.  No acute osseous abnormality identified. Calcified atherosclerosis at the skull base.  Stable cerebral volume. No midline shift, mass effect, or evidence of intracranial mass lesion. Stable ventricle size and configuration. Chronic Patchy and confluent cerebral white matter hypodensity. No evidence of cortically based acute infarction identified. No acute intracranial hemorrhage identified. No suspicious intracranial vascular hyperdensity.  IMPRESSION: 1. No acute intracranial abnormality. No  acute traumatic injury identified. 2. Chronic cerebral white matter changes, most commonly due to chronic small vessel disease.   Electronically Signed   By: Genevie Ann M.D.   On: 05/31/2015 16:07   Ct Chest W Contrast  05/31/2015   CLINICAL DATA:  74 year old male with 25 lb weight loss, former smoker, abnormal left apex on chest x-rays. Subsequent encounter.  EXAM: CT CHEST WITH CONTRAST  TECHNIQUE: Multidetector CT imaging of the chest was performed during intravenous contrast administration.  CONTRAST:  65m ISOVUE-300 IOPAMIDOL (ISOVUE-300) INJECTION 61%  COMPARISON:  Chest radiographs 04/21/2015.  FINDINGS: There is lobular abnormal soft tissue with heterogeneous hyper enhancement occupying the left lung apex corresponding to the abnormal area on the May radiographs. This is masslike and there is erosion of the posterior left second and third rib associated. The abnormal soft tissue extends toward the left second neural foramen. No intraspinal involvement. No thoracic vertebral body erosion or destruction. The abnormal soft tissue tracks medially along the left paraspinal soft tissues nearly to the level of the aortic arch (series 3, image 15 and coronal images 70 and 74). The tumor is crescent-shaped, but all told, encompasses an area of 66 x 68 x 56 mm (AP by transverse by CC). Adjacent septal and nodular interstitial thickening in the left lung apex suspicious for lymphangitic spread of carcinoma (series 4, image 17).  No thoracic inlet or prevascular lymphadenopathy. Negative thyroid. Patent great vessels, calcified atherosclerosis of the aorta and its branches, especially the left subclavian artery origin.  There is superimposed abnormal anterior mediastinal soft tissue with homogeneous intermediate density (38 Hounsfield units) extending from the innominate vein inferiorly along the anterior mediastinum toward the base of the aorta. This encompasses 24 x 26 x 73 mm (AP by transverse by CC). This does not  resemble typical metastatic lymphadenopathy. There is mild left hilar lymph node enlargement, 8 mm (series 3, image 32). Aside from these lesions, no other evidence of mediastinal nodal disease. No pericardial effusion.  There are metastatic pleural implants in the left chest associated with a small layering left pleural effusion (series 3, image 37). There are also superimposed multiple round pulmonary nodules in the left upper lobe lower lobes ranging 4 mm up to 19 mm diameter.  Underlying centrilobular emphysema. There are 2 nonspecific 2-3 mm lung nodules in the right lower lobe (series 4, images 32 and 48). No other acute right lung finding. No right pleural effusion.  Left greater than right adrenal metastases, 4.5 cm on the left and 1.8 cm on the right. Heterogeneously hypo enhancing central liver mass measuring 3.5 cm also is most compatible with metastasis. There are small are liver dome lesions also likely metastases ranging from 5 mm to 21 mm.  Possible 9 mm superior splenic pole metastasis on series 3, image 52). Nearby nonspecific 3-4 mm soft tissue nodules just under the  left hemidiaphragm (series 3, image 57)  Negative visualized kidneys and bowel in the upper abdomen.  Osteopenia. Aside from the left upper rib involvement described earlier, no definite additional osseous metastatic disease is identified. No acute pathologic fracture is identified.  IMPRESSION: 1. 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 Salient findings discussed by telephone with Dr. Kelton Pillar on 05/31/2015 at 1618 hours. 2. Pancoast type left apical lung tumor with pleural implants, paraspinal soft tissue invasion, left second and third rib erosion, left lung pulmonary metastases, adrenal metastases, liver metastases, and possible splenic metastasis. 3. There is also a more homogeneous appearing and elongated (7 cm) soft tissue mass in the anterior mediastinum which I would favor to be unrelated and thymic  in origin. 4. Otherwise paucity of mediastinal/hilar node involvement. Mild enlargement of a left hilar node up to 8 mm is suspicious for early nodal disease. 5. Small layering left pleural effusion.   Electronically Signed   By: Genevie Ann M.D.   On: 05/31/2015 16:31   Mr Jeri Cos OM Contrast  06/15/2015   CLINICAL DATA:  Left apical lung mass. Cancer staging. Confusion, difficulty walking, and numbness and weakness in the arms and legs. Blurry vision.  EXAM: MRI HEAD WITHOUT AND WITH CONTRAST  TECHNIQUE: Multiplanar, multiecho pulse sequences of the brain and surrounding structures were obtained without and with intravenous contrast.  CONTRAST:  18m MULTIHANCE GADOBENATE DIMEGLUMINE 529 MG/ML IV SOLN  COMPARISON:  Head CT 05/31/2015  FINDINGS: There is no evidence of acute infarct, intracranial hemorrhage, mass, midline shift, or extra-axial fluid collection. There is moderate cerebral atrophy. Patchy and confluent T2 hyperintensities throughout the subcortical and deep cerebral white matter bilaterally are nonspecific but compatible with moderate chronic small vessel ischemic disease. A small developmental venous anomaly is incidentally noted in the right cerebellum. No abnormal enhancement is identified elsewhere.  Prior bilateral cataract extraction and left scleral banding are noted. There is mild right ethmoid air cell mucosal thickening and a right maxillary sinus mucous retention cyst. Mastoid air cells are clear. Major intracranial vascular flow voids are preserved. No destructive osseous lesions are identified.  IMPRESSION: 1. No evidence of intracranial metastases. 2. Moderate chronic small vessel ischemic disease and cerebral atrophy.   Electronically Signed   By: ALogan Bores  On: 06/15/2015 15:36   Nm Pet Image Initial (pi) Skull Base To Thigh  06/16/2015   CLINICAL DATA:  Initial treatment strategy for left apical Pancoast tumor with pleural implants, os paraspinal soft tissue invasion, rib  erosions, and suspected metastatic disease to the left lung, adrenal glands, liver, and possibly spleen.  EXAM: NUCLEAR MEDICINE PET SKULL BASE TO THIGH  TECHNIQUE: 7.3 mCi F-18 FDG was injected intravenously. Full-ring PET imaging was performed from the skull base to thigh after the radiotracer. CT data was obtained and used for attenuation correction and anatomic localization.  FASTING BLOOD GLUCOSE:  Value: 111 mg/dl  COMPARISON:  Multiple exams, including 05/31/2015  FINDINGS: NECK  In the vicinity of the left rotatores cervicis muscle at about the C4 level, there is day focus of hypermetabolic activity with maximum SUV 3.8.  In the vicinity of the left C7-T1 neural foramen and adjacent lateral extraforaminal space.  A left lateral supraclavicular lymph node measuring 8 mm in short axis on image 45 series 4 has maximum standard uptake value 4.5. There is an abnormal focus of hypermetabolic activity measuring 6.7.  CHEST  The left apical Pancoast tumor invading the chest wall  and the top left 3 ribs is highly hypermetabolic and may extend into the left T2-3 neural foraminal region, maximum standard uptake value 16.0. Tumor related to this mass extends in the left pleural space, causing a pleural rind. Bilateral hypermetabolic axillary lymph nodes are present along with small hypermetabolic mediastinal lymph nodes, potential metastatic lesions in the atelectatic left lower lobe, and left hilar and infrahilar nodes. There are hypermetabolic lesions in the right and left chest wall/subcutaneous tissues, as well as osseous metastatic lesions involving the ribs, upper thoracic vertebra, and left scapular spine. An index right axillary lymph node measures 1 cm in short axis on image 55 series 4 and has maximum standard uptake value of 13.7.  ABDOMEN/PELVIS  Extensive metastatic burden of the abdomen including bilateral adrenal metastatic lesions, scattered hepatic metastatic lesions, hypermetabolic peripancreatic  adenopathy, a left upper pole renal capsular/ carry renal metastatic lesion, a right perirenal space hypermetabolic lesion above the right kidney, left upper quadrant mesenteric metastatic lesions; subcutaneous metastatic lesions along the anterior abdomen and left posterior buttock, as well as musculoskeletal metastatic lesions.  As an index lesion, the left adrenal mass measures 4 point far by 2.7 cm on image 107 of series 4 and has maximum standard uptake value 9.8. The dominant segment 4 mass in the liver measures 5.9 by 4.5 cm on image 109 of series 4 and has maximum standard uptake value 10.0. The left carry renal metastatic focus has maximum standard uptake value of 7.4. The left lower quadrant anterior abdominal wall subcutaneous mass just below the level of the iliac crests measures 2.1 by 1.4 cm on image 142 of series 4 and has maximum standard uptake value of 8.9.  No definite splenic lesion.  SKELETON  Osseous metastatic lesions involving the ribs, left scapula, various vertebra, bony pelvis, and right psoas muscle. Among these, some of the more significant lesions include a left eccentric T10 posterior vertebral body lesion which could have an epidural component, maximum standard uptake value 16.9; a destructive left posterior acetabular wall mass which may be eroding into the joint and is causing significant breakdown of the posterior wall with extraosseous spread, maximum standard uptake value 13.5 ; and a right iloipsoas metastatic lesion measuring with maximum standard uptake value 7.6.  IMPRESSION: 1. Left Pancoast tumor with extensive metastatic disease to the skeleton, thoracoabdominal subcutaneous tissues, muscular tissues, left pleural surface, both adrenal glands, liver, left upper quadrant mesentery, and perirenal spaces. 2. Some of the lesions involve the spine and some are along the neural foramina/epidural margin in the cervical and thoracic spine.   Electronically Signed   By: Van Clines M.D.   On: 06/16/2015 16:09   US Biopsy  06/19/2015   CLINICAL DATA:  Lung mass, multiple liver lesions  EXAM: ULTRASOUND-GUIDED CORE LIVER BIOPSY  TECHNIQUE: An ultrasound guided liver biopsy was thoroughly discussed with the patient and questions were answered. The benefits, risks, alternatives, and complications were also discussed. The patient understands and wishes to proceed with the procedure. A verbal as well as written consent was obtained.  Survey ultrasound of the liver was performed, a representative lesion localized, and an appropriate skin entry site was determined. Skin site was marked, prepped with Betadine, and draped in usual sterile fashion, and infiltrated locally with 1% lidocaine.  Intravenous Fentanyl and Versed were administered as conscious sedation during continuous cardiorespiratory monitoring by the radiology RN, with a total moderate sedation time of 6 minutes.  A 17 gauge trocar needle was advanced under  ultrasound guidance into the liver to the margin of the target area. 3 coaxial 18gauge core samples were then obtained through the guide needle. The guide needle was removed. Post procedure scans demonstrate no apparent complication.  COMPLICATIONS: COMPLICATIONS None immediate  FINDINGS: Multiple discrete liver lesions are identified, some coalescing into larger masslike areas, corresponding to findings on CT. Core biopsy of a representative region was performed without complication.  IMPRESSION: 1. Technically successful ultrasound guided core liver lesion biopsy.   Electronically Signed   By: Lucrezia Europe M.D.   On: 06/19/2015 14:08     ASSESSMENT/PLAN:  No problem-specific assessment & plan notes found for this encounter.  the patient is a 74 year old Caucasian male recently diagnosed with metastatic carcinoma. At the time of dictation and initially his pathology was not finalized. It is now available and reveals metastatic adenocarcinoma. His MRI of the brain with  and without contrast was negative for metastatic disease. The PET scan however revealed a left Pancoast tumor as well as extensive skeletal metastasis, thoracic thoracoabdominal subcutaneous tissue, muscular tissue metastasis, left pleural surface, with adrenal glands, blood liver and the left quadrant mesentery as well as the degree renal spaces. Similar lesions involve the spine and some are along the neural foramina/epidural margin in the cervical and thoracic spine. The patient was discussed with and also seen by Dr. Julien Nordmann. We will plan to refer him to radiation oncology for palliative radiation for pain control. We'll have him return in 2-3 weeks to discuss treatment options for his widely metastatic adenocarcinoma. He may not be strong enough to proceed with systemic chemotherapy and a hospice and palliative care referral would be a reasonable option. His tissue is being sent to [facility] one for molecular markers. If he is positive for EGFR or ALK mutations is possible he could be treated with oral targeted chemotherapy agent.  Awilda Metro E, PA-C 06/21/2015  All questions were answered. The patient knows to call the clinic with any problems, questions or concerns. We can certainly see the patient much sooner if necessary.  ADDENDUM: Hematology/Oncology Attending: I had a face to face encounter with the patient today. I recommended his care plan. This is a very pleasant 74 years old white male recently diagnosed with metastatic non-small cell lung cancer, adenocarcinoma presented with left Pancoast tumor as well as extensive metastatic disease to the skeleton, thoracoabdominal subcutaneous tissue, muscular tissue, left pleural service as well as both adrenal glands, liver, left upper quadrant mesentery as well as perirenal space. Some of the lesion involves the spine and some are along the neural foramina, epidural margin in the cervical and thoracic spines. He underwent ultrasound-guided core  biopsy of one of the liver lesion and the final pathology was consistent with metastatic adenocarcinoma. I had a lengthy discussion with the patient and his wife about his current disease status and treatment options. The final pathology was not available at the time of the visit. The patient continues to have severe pain in the back. He did not notice any significant improvement in his pain or appetite after the Medrol Dosepak. I recommended for the patient to see radiation oncology for evaluation and discussion of palliative radiotherapy to the painful metastatic bone lesion as well as the left Pancoast tumor. This is expected to take 2-3 weeks. I will also request his tumor tissue to be tested for EGFR mutation as well as ALK gene translocation. I will see the patient back for follow-up visit in 3 weeks for reevaluation and discussion  of her systemic treatment options after the palliative radiation. The patient was advised to call immediately if he has any concerning symptoms in the interval.  Disclaimer: This note was dictated with voice recognition software. Similar sounding words can inadvertently be transcribed and may be missed upon review. Eilleen Kempf., MD 06/24/2015

## 2015-06-21 NOTE — Telephone Encounter (Signed)
Gave patient avs report and appointment with Dr. Lisbeth Renshaw for 07/03/15. Message to MM/AJ re 3 week f/u - MM booked until 07/19/15. Patient aware he will be contacted re f/u with MM.

## 2015-06-22 ENCOUNTER — Telehealth: Payer: Self-pay | Admitting: Internal Medicine

## 2015-06-22 ENCOUNTER — Telehealth: Payer: Self-pay | Admitting: *Deleted

## 2015-06-22 NOTE — Telephone Encounter (Signed)
Wife called for clarification of radiation appointments.  Timothy Pena has told her "he is not coming tomorrow for radiation.  He is exhausted from yesterday.  Biopsy results were not here and feels it was a waste of time.  We're not living together but not divorced.  I drive twenty-five minutes to pick him up and drive from beyond [place] area to come there.  He's borderline dementia and gets confused and agitated when office calls.  We're originally from [place]."  Informed her Bx is in and RT is preparing for visit tomorrow.  She will try to convince him to come in.  Notified Val with Dr. Pablo Ledger.  "If he still doesn't want to come, will call to reschedule.  Disappointing appointments could not be coordinated better."

## 2015-06-22 NOTE — Telephone Encounter (Signed)
Per response from MM - ok to use MD only slot for 3 week f/u. Spoke with wife re appointment for 8/3 @ 10:30 am for lab/MM.

## 2015-06-23 ENCOUNTER — Ambulatory Visit: Admission: RE | Admit: 2015-06-23 | Payer: Medicare Other | Source: Ambulatory Visit

## 2015-06-23 ENCOUNTER — Emergency Department (HOSPITAL_COMMUNITY)
Admission: EM | Admit: 2015-06-23 | Discharge: 2015-06-24 | Disposition: A | Discharge status: Home / Self Care | Payer: Medicare Other | Attending: Emergency Medicine | Admitting: Emergency Medicine

## 2015-06-23 ENCOUNTER — Ambulatory Visit
Admission: RE | Admit: 2015-06-23 | Discharge: 2015-06-23 | Disposition: A | Discharge status: Home / Self Care | Payer: Medicare Other | Source: Ambulatory Visit | Attending: Radiation Oncology | Admitting: Radiation Oncology

## 2015-06-23 ENCOUNTER — Encounter (HOSPITAL_COMMUNITY): Payer: Self-pay

## 2015-06-23 DIAGNOSIS — Z720 Tobacco use: Secondary | ICD-10-CM | POA: Diagnosis not present

## 2015-06-23 DIAGNOSIS — F4321 Adjustment disorder with depressed mood: Secondary | ICD-10-CM | POA: Diagnosis not present

## 2015-06-23 DIAGNOSIS — Z8659 Personal history of other mental and behavioral disorders: Secondary | ICD-10-CM | POA: Insufficient documentation

## 2015-06-23 DIAGNOSIS — C799 Secondary malignant neoplasm of unspecified site: Secondary | ICD-10-CM | POA: Insufficient documentation

## 2015-06-23 DIAGNOSIS — R52 Pain, unspecified: Secondary | ICD-10-CM | POA: Diagnosis not present

## 2015-06-23 DIAGNOSIS — C7951 Secondary malignant neoplasm of bone: Secondary | ICD-10-CM | POA: Insufficient documentation

## 2015-06-23 DIAGNOSIS — Z515 Encounter for palliative care: Secondary | ICD-10-CM | POA: Insufficient documentation

## 2015-06-23 DIAGNOSIS — I10 Essential (primary) hypertension: Secondary | ICD-10-CM | POA: Diagnosis not present

## 2015-06-23 DIAGNOSIS — Z8709 Personal history of other diseases of the respiratory system: Secondary | ICD-10-CM | POA: Diagnosis not present

## 2015-06-23 DIAGNOSIS — Z880 Allergy status to penicillin: Secondary | ICD-10-CM | POA: Insufficient documentation

## 2015-06-23 DIAGNOSIS — F0390 Unspecified dementia without behavioral disturbance: Secondary | ICD-10-CM | POA: Insufficient documentation

## 2015-06-23 DIAGNOSIS — Z79899 Other long term (current) drug therapy: Secondary | ICD-10-CM | POA: Diagnosis not present

## 2015-06-23 DIAGNOSIS — C3490 Malignant neoplasm of unspecified part of unspecified bronchus or lung: Secondary | ICD-10-CM | POA: Insufficient documentation

## 2015-06-23 DIAGNOSIS — M545 Low back pain: Secondary | ICD-10-CM | POA: Diagnosis not present

## 2015-06-23 DIAGNOSIS — Z8739 Personal history of other diseases of the musculoskeletal system and connective tissue: Secondary | ICD-10-CM | POA: Diagnosis not present

## 2015-06-23 LAB — CBC WITH DIFFERENTIAL/PLATELET
BASOS PCT: 0 % (ref 0–1)
Basophils Absolute: 0 10*3/uL (ref 0.0–0.1)
Eosinophils Absolute: 0.1 10*3/uL (ref 0.0–0.7)
Eosinophils Relative: 1 % (ref 0–5)
HEMATOCRIT: 30 % — AB (ref 39.0–52.0)
HEMOGLOBIN: 9.8 g/dL — AB (ref 13.0–17.0)
LYMPHS ABS: 1.1 10*3/uL (ref 0.7–4.0)
Lymphocytes Relative: 11 % — ABNORMAL LOW (ref 12–46)
MCH: 32.3 pg (ref 26.0–34.0)
MCHC: 32.7 g/dL (ref 30.0–36.0)
MCV: 99 fL (ref 78.0–100.0)
Monocytes Absolute: 0.8 10*3/uL (ref 0.1–1.0)
Monocytes Relative: 8 % (ref 3–12)
NEUTROS PCT: 80 % — AB (ref 43–77)
Neutro Abs: 8 10*3/uL — ABNORMAL HIGH (ref 1.7–7.7)
Platelets: 348 10*3/uL (ref 150–400)
RBC: 3.03 MIL/uL — ABNORMAL LOW (ref 4.22–5.81)
RDW: 14.2 % (ref 11.5–15.5)
WBC: 10 10*3/uL (ref 4.0–10.5)

## 2015-06-23 LAB — BASIC METABOLIC PANEL
Anion gap: 4 — ABNORMAL LOW (ref 5–15)
BUN: 20 mg/dL (ref 6–20)
CHLORIDE: 108 mmol/L (ref 101–111)
CO2: 26 mmol/L (ref 22–32)
Calcium: 9 mg/dL (ref 8.9–10.3)
Creatinine, Ser: 0.69 mg/dL (ref 0.61–1.24)
GFR calc Af Amer: >60 mL/min (ref >60)
Glucose, Bld: 87 mg/dL (ref 65–99)
POTASSIUM: 3.7 mmol/L (ref 3.5–5.1)
SODIUM: 138 mmol/L (ref 135–145)

## 2015-06-23 LAB — URINALYSIS, ROUTINE W REFLEX MICROSCOPIC
Bilirubin Urine: NEGATIVE
GLUCOSE, UA: NEGATIVE mg/dL
HGB URINE DIPSTICK: NEGATIVE
Ketones, ur: NEGATIVE mg/dL
Nitrite: NEGATIVE
Protein, ur: NEGATIVE mg/dL
SPECIFIC GRAVITY, URINE: 1.027 (ref 1.005–1.030)
Urobilinogen, UA: 1 mg/dL (ref 0.0–1.0)
pH: 6.5 (ref 5.0–8.0)

## 2015-06-23 LAB — URINE MICROSCOPIC-ADD ON

## 2015-06-23 MED ORDER — OXYCODONE-ACETAMINOPHEN 5-325 MG PO TABS
1.0000 | ORAL_TABLET | Freq: Once | ORAL | Status: AC
  Administered 2015-06-23: 1 via ORAL
  Filled 2015-06-23: qty 1

## 2015-06-23 MED ORDER — MORPHINE SULFATE 2 MG/ML IJ SOLN: 2.0000 mg | INTRAMUSCULAR | Status: DC | PRN

## 2015-06-23 MED ORDER — SENNOSIDES-DOCUSATE SODIUM 8.6-50 MG PO TABS: 1.0000 | ORAL_TABLET | Freq: Every evening | ORAL | Status: DC | PRN

## 2015-06-23 MED ORDER — SODIUM CHLORIDE 0.9 % IV SOLN
INTRAVENOUS | Status: DC
  Administered 2015-06-23: 14:00:00 via INTRAVENOUS

## 2015-06-23 MED ORDER — ONDANSETRON HCL 4 MG/2ML IJ SOLN: 4.0000 mg | Freq: Four times a day (QID) | INTRAMUSCULAR | Status: DC | PRN

## 2015-06-23 NOTE — ED Notes (Signed)
Per EMS, pt has lung cancer with mets.  From home.  Pt states generalized pain has increased since last night and uncontrolled.  Pt family needs pain addressed and possible hospice consult d/t progression of disease.  Vitals:  128/70, hr 102, resp 19,   Ending - 92% ra, hr 76.

## 2015-06-23 NOTE — Patient Instructions (Signed)
You're being referred to radiation oncology for palliative radiation for pain control Your final pathology is pending Follow-up in 2-3 weeks to discuss your pathology results and treatment options

## 2015-06-23 NOTE — Progress Notes (Signed)
CSW met with pt at bedside. Wife was present. Patient confirms that he comes from home. Patient states he lives in [place] alone. However, wife informed CSW that since the pt's health has been declining she has been visiting the pt everyday. Wife informed CSW that she has been helping pt complete his ADL's and chores. She states that she is his primary support.  Patient and wife informed CSW that he has fallen x1 in the past 6 months.  CSW spoke with Truman Hayward of [facility] who states that their facility is offering the pt a bed. However, she states that the pt will not have a bed there until tomorrow.   Truman Hayward is currently at bedside with family. She states that she and family will complete paperwork for the facility.  Patient and wife state that they do not have any questions at this time.  Willette Brace [phone] ED CSW 06/23/2015 06/23/2015

## 2015-06-23 NOTE — ED Provider Notes (Signed)
  Physical Exam  BP 136/96 mmHg  Pulse 106  Temp(Src) 98.2 F (36.8 C) (Oral)  Resp 18  SpO2 93%  Physical Exam No physical exam was done myself.  ED Course  Procedures Patient was signed out to me by Timothy Pena. The patient was recently diagnosed with metastatic cancer. He refused intervention and would like hospice care. Case management, social work, and palliative care were all consult. The plan is to have hospice admit him.  Hospice provider states she has a bed for him at 9am tomorrow. After speaking with the charge nurse and informing Dr. Winfred Pena of the arrangements, the patient will stay in the ED until he is transferred at [name]. He has PRN morphine and zofran which he has refused but is available to him.  I told Timothy Pena. about this patient and the plan.        Timothy Glazier, PA-C 06/24/15 [id number]  Timothy Dakin, MD 06/24/15 [phone]

## 2015-06-23 NOTE — ED Notes (Signed)
Bed: WA28 Expected date:  Expected time:  Means of arrival:  Comments: 

## 2015-06-23 NOTE — ED Notes (Signed)
Social work at bedside.  

## 2015-06-23 NOTE — Progress Notes (Signed)
CSW re consulted by RN CM. Patient and patient wife live separately, and patient is unsafe to be at home alone at this time. Per patient and patient wife, patient unable to sit up independently from the couch, is unable to get up, and is solely relying on daily visits from patient wife for food, water, and bathroom. Patient also has a cat who has been put up and not let out for food.   Pt and pt wife both agree pt returning home is not an option without patient hiring more help to stay with patient, hospice services, and continued visits from pt wife. Pt declined going to skilled nursing facility at this time. Pt and pt wife inquired about residential hospice. At this time, no prognosis has been given.   CSW reached out to cancer center for Dr. Inda Merlin who is following patient however is out of the office today. Dr. Marcene Duos is covering patients today at can be reached at pager # [phone].   CSW discussing with RN Cm regarding pt PCP who had started some palliative/hospice services already and gaining clarification on patient status.    CSW discussed with RN CM, ED-PA, and Patient, further work up to be done at this time. Referrals made to [facility].   Belia Heman, [place] Work  [employer] [phone]

## 2015-06-23 NOTE — Progress Notes (Signed)
Per Harmon Pier of hospice, paperwork has been completed and bed is available at [facility].  Harmon Pier has asked to have social worker call her tomorrow at [phone] prior to patient transfer to make sure patient has correct information faxed to [facility] place (discharge instructions etc.)  Fax number to [facility] is [phone].

## 2015-06-23 NOTE — ED Notes (Signed)
ED providers have asked that the Pt stay in the department overnight, due to having a bed at Hospice bed around 9am.

## 2015-06-23 NOTE — ED Notes (Signed)
Bed: FB51 Expected date:  Expected time:  Means of arrival:  Comments: EMS- 74yo M, [place] Pt, chronic pain

## 2015-06-23 NOTE — Progress Notes (Signed)
CSW received consult for possible needs. CSW spoke with PA who states patient is interested in home hospice. CSW consulted with RN CM. Patient was originally going to go through with radiation for pain control, however pt now states he is not interested. Patient and wife voiced interest in home hospice.   Belia Heman, [place] Work  [employer] [phone]

## 2015-06-23 NOTE — ED Provider Notes (Signed)
CSN: [id number]     Arrival date & time 06/23/15  1030 History   First MD Initiated Contact with Patient 06/23/15 1041     Chief Complaint  Patient presents with  . Generalized Pain   . Social Work Scientific laboratory technician      (Consider location/radiation/quality/duration/timing/severity/associated sxs/prior Treatment) The history is provided by the patient and medical records.    This is a 74 year old male with history of hypertension, seasonal allergies, anxiety, arthritis, dementia, presenting to the ED for uncontrolled pain. Patient unfortunately was recently diagnosed with metastatic lung cancer with widespread metastases.  Patient endorses generalized pain throughout his back.  Denies numbness, weakness, or paresthesias of legs.  No loss of bowel or bladder control.  No chest pain or frank SOB, states he does get winded from time to time but that is not out of the ordinary.  Decreased appetite without nausea or vomiting.  Wife states the only time he eats or drinks when she comes to visit in the evenings, even then very limited intake.  Patient is seeing oncology, Dr. Earlie Server-- discussed possibility of radiation for pain control, however patient states he is not sure if he wants to do this.  He states he would like some pain control, has not been started on any medications thus far.  Wife called oncology office today, however physician out of town, recommended ER evaluation for possible hospice care.  VSS.  Past Medical History  Diagnosis Date  . Hypertension   . Seasonal allergies   . Complication of anesthesia     Constipation  . Anxiety     Due to surgeries  . Arthritis   . Dementia    Past Surgical History  Procedure Laterality Date  . Cataract extraction, bilateral Bilateral ~ 2012  . Dental implants    . Femur im nail Left 02/25/2013    Procedure: INTRAMEDULLARY (IM) NAIL FEMORAL;  Surgeon: Johnn Hai, MD;  Location: [facility];  Service: Orthopedics;  Laterality: Left;  . Retinal  detachment repair w/ scleral buckle le Left 05/25/2013  . Scleral buckle Left 05/25/2013    Procedure: SCLERAL BUCKLE;  Surgeon: Hayden Pedro, MD;  Location: [place];  Service: Ophthalmology;  Laterality: Left;  . Photocoagulation with laser Left 05/25/2013    Procedure: HEADSCOPE LASER;  Surgeon: Hayden Pedro, MD;  Location: [place];  Service: Ophthalmology;  Laterality: Left;  . Gas insertion Left 05/25/2013    Procedure: INSERTION OF GAS;  Surgeon: Hayden Pedro, MD;  Location: [place];  Service: Ophthalmology;  Laterality: Left;  C3F8  . Pars plana repair of retinal deatachment Left 06/29/2013    Pars plana vitrectomy, laser, PFO injection and removal,  Silicone Oil injection Archie Endo 06/29/2013  . 25 gauge pars plana vitrectomy with 20 gauge mvr port Left 06/29/2013    Procedure: 25 GAUGE PARS PLANA VITRECTOMY WITH 20 GAUGE MVR PORT; Membrame peel; Gas exchange; Laser treatment; Silicone oil injection; Perfluoron injection;  Surgeon: Hayden Pedro, MD;  Location: [place];  Service: Ophthalmology;  Laterality: Left;   Family History  Problem Relation Age of Onset  . Heart disease Mother     VALVE REPLACEMENT  . Lung cancer Father    History  Substance Use Topics  . Smoking status: Light Tobacco Smoker -- 0.20 packs/day for 53 years    Types: Cigarettes  . Smokeless tobacco: Never Used     Comment: 7/22//2014 "half a pack of cigarettes a week"  . Alcohol Use: 1.8 oz/week  3 Glasses of wine per week    Review of Systems  Musculoskeletal: Positive for arthralgias.  All other systems reviewed and are negative.     Allergies  Penicillins  Home Medications   Prior to Admission medications   Medication Sig Start Date End Date Taking? Authorizing Provider  amLODipine (NORVASC) 5 MG tablet Take 1 tablet (5 mg total) by mouth daily. 03/01/13   Belkys A Regalado, MD  donepezil (ARICEPT) 5 MG tablet Take 5 mg by mouth at bedtime. 04/21/15   Historical Provider, MD  methylPREDNISolone  (MEDROL DOSEPAK) 4 MG TBPK tablet Use as instructed 06/07/15   Curt Bears, MD  metoprolol tartrate (LOPRESSOR) 25 MG tablet Take 1 tablet (25 mg total) by mouth 2 (two) times daily. 03/01/13   Belkys A Regalado, MD  Propylene Glycol 0.6 % SOLN Apply to eye.    Historical Provider, MD   BP 134/91 mmHg  Pulse 72  Temp(Src) 98 F (36.7 C) (Oral)  Resp 18  SpO2 93%   Physical Exam  Constitutional: He is oriented to person, place, and time. He appears well-developed and well-nourished.  Elderly, generally ill appearing and weak  HENT:  Head: Normocephalic and atraumatic.  Mouth/Throat: Oropharynx is clear and moist.  Eyes: Conjunctivae and EOM are normal. Pupils are equal, round, and reactive to light.  Neck: Normal range of motion.  Cardiovascular: Normal rate, regular rhythm and normal heart sounds.   Pulmonary/Chest: Effort normal and breath sounds normal. No respiratory distress. He has no wheezes.  Abdominal: Soft. Bowel sounds are normal.  Musculoskeletal: Normal range of motion.  Neurological: He is alert and oriented to person, place, and time.  Skin: Skin is warm and dry.  Psychiatric: He has a normal mood and affect.  Nursing note and vitals reviewed.   ED Course  Procedures (including critical care time) Labs Review Labs Reviewed  CBC WITH DIFFERENTIAL/PLATELET - Abnormal; Notable for the following:    RBC 3.03 (*)    Hemoglobin 9.8 (*)    HCT 30.0 (*)    Neutrophils Relative % 80 (*)    Neutro Abs 8.0 (*)    Lymphocytes Relative 11 (*)    All other components within normal limits  BASIC METABOLIC PANEL - Abnormal; Notable for the following:    Anion gap 4 (*)    All other components within normal limits  URINALYSIS, ROUTINE W REFLEX MICROSCOPIC (NOT AT [facility])    Imaging Review No results found.   EKG Interpretation None      MDM   Final diagnoses:  Metastatic lung cancer (metastasis from lung to other site), unspecified laterality  Encounter for  palliative care   74 year old male with recently diagnosed metastatic lung cancer, here with generalized pain, mostly concentrated in back (has known mets to spine). Patient has no focal neurologic deficits on exam to suggest central cord or cauda equina.  Recent MRI brain and PET scan last week.  Followed by Dr. Earlie Server.  Patient was to possibly undergo radiation for pain control, however he has decided against this. He would like hospice to make him comfortable, preferably facility hospice as patient is too weak to care for himself at this time.  Basic lab work was obtained, no significant findings.  U/a without overt infection, culture pending.  I have discussed case with social work, case management, and palliative care whom have all seen and evaluated patient in the ED-- please see their separate consult notes.  Given no acute medical problems requiring emergent  hospitalization at this time, will have hospice evaluate patient in the ED to attempt to set up treatment.  Case discussed with attending physician, Dr. Ralene Bathe, who evaluated patient and agrees with assessment and plan of care.  Larene Pickett, PA-C 06/23/15 [place], MD 06/23/15 [phone]

## 2015-06-23 NOTE — Care Management Note (Addendum)
Case Management Note  Patient Details  Name: Timothy Pena MRN: [id number] Date of Birth: 11-Mar-1941  Subjective/Objective:        74 yr old male blue medicare pt Cm consulted by ED SW stating pt interested in home hospice   Pt and wife does not live together.  Wife visits pt to assist with shower, medicine, food,  ADLs, etc for possible 2-3 hours per wife.  States death of their child who had multiple medical issues and needed home health services Wife has provided pt with son's w/c States Pt refuses to have bedside commode but wife has noted pt with elimination accidents during her visits Wife reports she believes pt has dementia, memory issues by evidence of his cat being locked in a room without food and litter box causing accidents recently plus pt stating he had shower without her assist but not wet/dirty towels noted Report pt with appt on today at 3 pm for initiation of radiation as recommended by Dr Earlie Server after wife/pt did not feel appointment with Dr. Lisbeth Renshaw for 07/03/15 was soon enogh.  Report pcp on vacation but the office RN was initiating services for pt.  When asked his choice pt looked at wife and stated "all options are open" He asked wife her choice but she informed him they did not live together and it was not her choice but his choice.  Pt and wife at end of interaction were stating facility placement best for pt because pt was "not keen with someone coming in my home"  Wife states pt is weak and he was not able to sit up on "thursday evening and he called me at 1:45 this morning wanting me to take him to hospice." " he said he did not want anything else done" ED SW states wife said pt could not go to her home   Action/Plan:  Cm spoke with pt and wife at bedside CM reviewed in details medicare guidelines, Home hospice (length of stay in home, types of staff available, coverage, primary caregiver, up to 24 hrs before services may be started). CM provided pt/family with a list of  [place] home hospice agencies. Discussed home vs facility hospice, palliative vs hospice terminal care and the process needed for the pcp and oncologist to discuss if pt is a candidate for palliative/hospice services  Updated EDP/ED PA/NP and ED SW 1240 Spoke with Pcp RN, Elmyra Ricks Dr Laurann Montana out of town until Monday June 26 2015 . EDP states Dr Inda Merlin is not in office but Dr Marin Olp, [phone] pager.  RN confirms they were not work on anything  9 CM spoke with Hospice staff to initiate questions for processing this pt Cm spoke with Marzetta Board at [facility] at [phone] if admitted the get palliative then pt can be seen by palliative staff to evaluate pt for [facility] 1310 Cm spoke  with Gwinda Passe at [facility] who is aware CM spoke with Marzetta Board already Referral given officially EDP to complete workup labs, consult  1450 Cm spoke with ED SW Reviewed partial resulting labs,more pending.  Noted Hgb 9.8 only abnormality    Expected Discharge Date:   pending               Expected Discharge Plan:   pending  In-House Referral:   ED SW   Discharge planning Services   Hospice   Post Acute Care Choice:   hospice Choice offered to:   pt, wife  DME Arranged:  DME Agency:     HH Arranged:    HH Agency:     Status of Service:   pending    Additional Comments:  Robbie Lis, RN 06/23/2015, 12:25 PM

## 2015-06-23 NOTE — ED Notes (Signed)
Offered patient drink and food but he declined.

## 2015-06-23 NOTE — ED Notes (Signed)
Social Worker at bedside.

## 2015-06-23 NOTE — Progress Notes (Signed)
Cm spoke again with Timothy Pena and wife to update on call to pcp, [facility], update to [place] Wife states a hospice staff visited Timothy Pena  CM reviewed in details Private duty nursing (PDN-coverage, length of stay in the home types of staff available). CM provided wife with a list of [place] PDN services.  Timothy Pena noted to doze of and on during interaction He thanked Cm when she was leaving

## 2015-06-23 NOTE — Consult Note (Signed)
Consultation Note Date: 06/23/2015   Patient Name: Timothy Pena  DOB: 05/19/1941  MRN: [id number]  Age / Sex: 74 y.o., male   PCP: Elaine Griffin, MD Referring Physician: Elizabeth Rees, MD  Reason for Consultation: Establishing goals of care  Palliative Care Assessment and Plan Summary of Established Goals of Care and Medical Treatment Preferences   Patient is a 74-year-old gentleman with a past medical history significant for recently diagnosed metastatic lung cancer. Patient has essentially been having weight loss and diminished appetite since November 2015. In May, his wife states he was diagnosed with dementia and started on medications by his primary care physician. In June he was worked up with a CT scan that showed lung cancer and medical evidence of metastases. In early part of July 2016, the patient met with Dr. Muhammad. He underwent a PET scan and MRI of the brain. He also underwent liver biopsy recently. 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 The plan was for the patient to be started on palliative radiation treatments towards the end of July 2016. It was noted that the patient might not be strong enough to receive systemic chemotherapy afterwards. Patient has had ongoing weight loss and diminished appetite. He has had excruciating generalized pain as well as severe back discomfort. The patient requested his wife to drop them off at [facility] earlier today because of severe pain. He was brought to the emergency department and a palliative consultation was placed for further discussions.  Patient is resting in bed. He has received Percocet by mouth in the emergency department. He is opioid nave. He has not been prescribed any pain medications yet. Patient complains of generalized discomfort particularly back pain. He has had  increasing weakness. Wife complains of patient's marcated fatigue especially since the past 4-5 days. Patient has lost 3-4 pounds over the course of the past 1 week.   Discussed patient's current diagnosis of recently diagnosed metastatic lung cancer. Discussed CODE STATUS. CODE STATUS now established as DO NOT RESUSCITATE/DO NOT INTUBATE. Patient's wife Marion does have advance care planning documents available for my review-living will, healthcare power of attorney agent paperwork designating her wife Marion as his healthcare agent.   Goals of care discussions undertaken. Patient wishes to focus exclusively on a more of care that is designed for his comfort and safety. Discussed inclusion of hospice services. Discussed transfer to residential hospice facility. Patient will be given IV morphine when necessary. Bowel and antiemetic regimen will also be started. Eva from [facility] has been contacted. Await further recommendations from hospice. eceived consult for possible needs. CSW spoke with PA who states patient is interested in home hospice.    Contacts/Participants in Discussion: Primary Decision Maker:  Patient, this his wife Marianne   HCPOA: yes   living will and HCPOA documents reviewed. Patient has designated his wife Marion to be his healthcare agent.  Code Status/Advance Care Planning:  CODE STATUS discussions undertaken with the patient and his wife in detail. Discussed given the diagnosis of stage IV non-small cell lung cancer with widespread metastases, rapidly declining functional status, would recommend CODE STATUS of DO NOT RESUSCITATE/DO NOT INTUBATE. Patient has a living will desiring natural death. CODE STATUS is now established as DO NOT RESUSCITATE/DO NOT INTUBATE. A portable DO NOT RESUSCITATE has also been completed for purposes of hospice arrangements.  Symptom Management:   Percocet by mouth given   in the emergency department. Will have IV morphine also  available. Patient is essentially opioid nave.  Palliative Prophylaxis: The LAD bowel and antiemetic regimen here in the emergency department.  Additional Recommendations (Limitations, Scope, Preferences):  Focus on comfort measures only  Hospice consultation  Consider transfer to [facility] for pain management. Psycho-social/Spiritual:   Support System: Yes, wife Rosaria Ferries  Desire for further Chaplaincy support:no  Prognosis: Hours - Days  Discharge Planning:  Hospice facility   Domains of Care: - Physical: Generalized pain, back pain. History of 1 pack per day smoking for the last 55 years. Quit smoking 2 years ago. - Psychological: Flat affect and does not make eye contact much, does not verbalize much. Wife provides most of the history - Social: Lives locally. Lives by himself. Patient and wife are estranged. Wife lives 25 minutes away. She is still involved in his care. - Spiritual: No acute issues noted in initial encounter. - Cultural: 2 sons. One is deceased. The other lives in [place], [place]. Patient is estranged from his wife. She lives 25 minutes away however is involved in helping him. He used to work in a [employer], [employer]. - Imminently dying: No. Prognosis appears extremely guarded - Ethical/Legal: CODE STATUS is now DO NOT RESUSCITATE/DO NOT INTUBATE. Wife Rosaria Ferries is healthcare power of attorney agent.  Values: To focus exclusively on comfort. To avoid pain and suffering. Life limiting illness: Stage IV metastatic lung cancer.      Chief Complaint/History of Present Illness: Pain generalized, worse in the back.  Primary Diagnoses  Present on Admission:   recently diagnosed stage IV non-small cell lung cancer-adenocarcinoma. Recently diagnosed skeletal metastases, subcutaneous metastases, and adrenal, liver, spleen metastases. History of hypertension, cardiac arrhythmia History of left hip fracture after mechanical  fall History of retinal detachment status post multiple surgeries with limited vision in left eye.  Palliative Review of Systems: Completed. I have reviewed the medical record, interviewed the patient and family, and examined the patient. The following aspects are pertinent.  Past Medical History  Diagnosis Date  . Hypertension   . Seasonal allergies   . Complication of anesthesia     Constipation  . Anxiety     Due to surgeries  . Arthritis   . Dementia    History   Social History  . Marital Status: Married    Spouse Name: N/A  . Number of Children: N/A  . Years of Education: N/A   Social History Main Topics  . Smoking status: Light Tobacco Smoker -- 0.20 packs/day for 53 years    Types: Cigarettes  . Smokeless tobacco: Never Used     Comment: 7/22//2014 "half a pack of cigarettes a week"  . Alcohol Use: 1.8 oz/week    3 Glasses of wine per week  . Drug Use: No  . Sexual Activity: Not Currently   Other Topics Concern  . None   Social History Narrative   Family History  Problem Relation Age of Onset  . Heart disease Mother     VALVE REPLACEMENT  . Lung cancer Father    Scheduled Meds:  Continuous Infusions: . sodium chloride 125 mL/hr at 06/23/15 1403   PRN Meds:. Medications Prior to Admission:  Prior to Admission medications   Medication Sig Start Date End Date Taking? Authorizing Provider  acetaminophen (TYLENOL) 500 MG tablet Take 1,000 mg by mouth every 6 (six) hours as needed for moderate pain.   Yes Historical Provider, MD  amLODipine (NORVASC) 5  MG tablet Take 1 tablet (5 mg total) by mouth daily. 03/01/13  Yes Belkys A Regalado, MD  metoprolol tartrate (LOPRESSOR) 25 MG tablet Take 1 tablet (25 mg total) by mouth 2 (two) times daily. Patient taking differently: Take 25 mg by mouth daily.  03/01/13  Yes Belkys A Regalado, MD  methylPREDNISolone (MEDROL DOSEPAK) 4 MG TBPK tablet Use as instructed Patient not taking: Reported on 06/23/2015 06/07/15    Mohamed Mohamed, MD   Allergies  Allergen Reactions  . Penicillins Swelling    Throat swelling Throat swelling   CBC:    Component Value Date/Time   WBC 10.0 06/23/2015 1415   HGB 9.8* 06/23/2015 1415   HCT 30.0* 06/23/2015 1415   PLT 348 06/23/2015 1415   MCV 99.0 06/23/2015 1415   NEUTROABS 8.0* 06/23/2015 1415   LYMPHSABS 1.1 06/23/2015 1415   MONOABS 0.8 06/23/2015 1415   EOSABS 0.1 06/23/2015 1415   BASOSABS 0.0 06/23/2015 1415   Comprehensive Metabolic Panel:    Component Value Date/Time   NA 138 06/23/2015 1415   K 3.7 06/23/2015 1415   CL 108 06/23/2015 1415   CO2 26 06/23/2015 1415   BUN 20 06/23/2015 1415   CREATININE 0.69 06/23/2015 1415   GLUCOSE 87 06/23/2015 1415   CALCIUM 9.0 06/23/2015 1415   AST 12 05/25/2013 0926   ALT 10 05/25/2013 0926   ALKPHOS 119* 05/25/2013 0926   BILITOT 0.4 05/25/2013 0926   PROT 7.6 05/25/2013 0926   ALBUMIN 4.0 05/25/2013 0926    Physical Exam: Vital Signs: BP 120/79 mmHg  Pulse 74  Temp(Src) 98.2 F (36.8 C) (Oral)  Resp 16  SpO2 92% SpO2: SpO2: 92 % O2 Device: O2 Device: Not Delivered O2 Flow Rate:   Intake/output summary: No intake or output data in the 24 hours ending 06/23/15 1527 LBM:   Baseline Weight:   Most recent weight:    Exam Findings:  Older than stated age appearing Caucasian gentleman resting in bed. Lungs clear to auscultation diminished in bases Cardiac S1-S2 Abdomen soft nontender Extremities with no edema Neurologic nonfocal                       Palliative Performance Scale: 30% Additional Data Reviewed: Recent Labs     06/23/15  1415  WBC  10.0  HGB  9.8*  PLT  348  NA  138  BUN  20  CREATININE  0.69     Time In: 1430 Time Out: 1530 Time Total: 60 minutes. Greater than 50%  of this time was spent counseling and coordinating care related to the above assessment and plan.  Signed by:  , MD [phone]  , MD  06/23/2015, 3:27 PM  Please contact  Palliative Medicine Team phone at [phone] for questions and concerns.     

## 2015-06-24 DIAGNOSIS — M545 Low back pain, unspecified: Secondary | ICD-10-CM | POA: Insufficient documentation

## 2015-06-24 DIAGNOSIS — C3490 Malignant neoplasm of unspecified part of unspecified bronchus or lung: Secondary | ICD-10-CM | POA: Diagnosis not present

## 2015-06-24 DIAGNOSIS — C799 Secondary malignant neoplasm of unspecified site: Secondary | ICD-10-CM

## 2015-06-24 DIAGNOSIS — F4321 Adjustment disorder with depressed mood: Secondary | ICD-10-CM | POA: Insufficient documentation

## 2015-06-24 DIAGNOSIS — Z515 Encounter for palliative care: Secondary | ICD-10-CM | POA: Diagnosis not present

## 2015-06-24 NOTE — ED Notes (Signed)
Pt found standing on the side of the bed. Pt states he was trying to do something but was unable to verbally express exactly what he was trying to do. Pt assisted back to bed. Pt very weak. Pt instructed to call the next time that he needs to get from the bed.  Pt denies any other needs at this time and states he will use the call bell next time. Bed alarm initiated.

## 2015-06-24 NOTE — Progress Notes (Signed)
   Daily Progress Note   Patient Name: Timothy Pena       Date: 06/24/2015 DOB: [birth year]  Age: 74 y.o. MRN#: [id number] Attending Physician: No att. providers found Primary Care Physician: Osborne Casco, MD Admit Date: 06/23/2015 Life limiting illness: Stage IV metastatic lung cancer. Reason for Consultation/Follow-up: Establishing goals of care  Subjective:  back pain, sitting in bed, trying to eat breakfast Interval Events: Appreciate [facility] assistance, to go to inpatient hospice today Length of Stay: none  Current Medications: Scheduled Meds:     Continuous Infusions: . sodium chloride Stopped (06/24/15 0728)    PRN Meds: morphine injection, ondansetron (ZOFRAN) IV, senna-docusate  Palliative Performance Scale: 30%     Vital Signs: BP 143/81 mmHg  Pulse 73  Temp(Src) 98.2 F (36.8 C) (Oral)  Resp 18  SpO2 94% SpO2: SpO2: 94 % O2 Device: O2 Device: Not Delivered O2 Flow Rate:    Intake/output summary:  Intake/Output Summary (Last 24 hours) at 06/24/15 1012 Last data filed at 06/24/15 0403  Gross per 24 hour  Intake    240 ml  Output    390 ml  Net   -150 ml   LBM:   Baseline Weight:   Most recent weight:    Physical Exam: Mild distress due to back pain      Lungs clear to auscultation diminished in bases Cardiac S1-S2 Abdomen soft nontender Extremities with no edema Neurologic nonfocal Additional Data Reviewed: Recent Labs     06/23/15  1415  WBC  10.0  HGB  9.8*  PLT  348  NA  138  BUN  20  CREATININE  0.69     Problem List:  Patient Active Problem List   Diagnosis Date Noted  . Lung cancer   . Pain   . Encounter for palliative care   . 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 Lung mass 06/05/2015  . Atherosclerosis of native arteries of the extremities with intermittent claudication 10/25/2013  . Tobacco use disorder 10/25/2013  . Other specified cardiac dysrhythmias(427.89) 10/25/2013  . 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 Other B-complex deficiencies 04/20/2013  . Unspecified constipation 04/20/2013  . Essential hypertension, benign 04/20/2013  . Acute delirium 02/26/2013  . Hip fracture, left 02/25/2013  . Leukocytosis 02/25/2013  . Hyponatremia 02/25/2013  . Hypokalemia 02/25/2013  . Acute kidney failure 02/25/2013     Palliative Care Assessment & Plan    Code Status:  DNR  Goals of Care:   hospice today  Desire for further Chaplaincy support:no  3. Symptom Management:   PRN percocet and PRN IV Morphine  4. Palliative Prophylaxis:  Stool Softener: yes  5. Prognosis: Hours - Days  5. Discharge Planning: Hospice facility   Care plan was discussed with  Patient   Thank you for allowing the Palliative Medicine Team to assist in the care of this patient.   Time In: 0730 Time Out: 0755 Total Time 25 Prolonged Time Billed  no     Greater than 50%  of this time was spent counseling and coordinating care related to the above assessment and plan.   Loistine Chance, MD  06/24/2015, 10:12 AM  [phone] Please contact Palliative Medicine Team phone at [phone] for questions and concerns.

## 2015-06-24 NOTE — Discharge Instructions (Signed)
Bone Metastases  Cancerous growths can begin in any part of the body. The original site of cancer is called the primary tumor or primary cancer (for example, breast cancer). After cancer has developed in one area of the body, cancerous cells from that area can break away and travel through the body's bloodstream. If these cancerous cells begin growing in another place in the body, they are called metastases. Bone metastases are cancer cells that have spread to the bone (which is different from a cancer that starts in the bone).  These secondary growths are like the original tumor. For example, if a prostate cancer spreads to bone it is called metastatic prostate cancer, or prostate cancer metastatic to bone, but not bone cancer. Cancers can spread to almost any bone; the spine and pelvis are often involved.   Any type of cancer can spread to the bone, but the most common are breast, lung, kidney, thyroid and prostate cancers. Sometimes the primary tumor is not discovered until there are bone problems. If the primary cancer location cannot be discovered, the cancer is called cancer of unknown primary location.  SYMPTOMS   Pain in the bones is the main symptom of bone metastases. Some other problems may occur first including:  · Decreased appetite.  · Nausea.  · Muscle weakness.  · Confusion.  · Unusual sleep patterns due to discomfort.  · Overly tired (fatigue).  · Restlessness.  Frail or brittle bones may lead to broken bones (fractures) that lead to learning what is wrong (diagnosis). A tumor often weakens the bones.   DIAGNOSIS   Metastatic cancers may be found months or years after or at the same time as the primary tumor. When a second tumor is found in a patient who has been treated for cancer, it is more often a metastasis than another primary tumor.   The patient's symptoms, physical examination, X-rays and blood tests may suggest a bone metastases. In addition, an examination of tissue or a cell sample  (biopsy) is usually done to find the cancer. This sample is removed with a needle. This tissue sample must be looked at under a microscope to confirm a diagnosis.  TREATMENT   Options generally include treatments that give relief from symptoms (palliative) or curative. Those with advanced, metastasized cancer may receive treatment focused on pain relief and prolonging life. These treatments depend on the type of cancer and its location.   Treatment for cancer depends on its type and location. Some of these treatments are:  · Surgery to remove the original tumor and/or to remove parts of the body that produce hormones and other chemicals that make cancer worse.  · Treatment with drugs (chemotherapy).  · Bone marrow transplantations on rare occasions.  · Radiation therapy (radiotherapy).  · Hormonal therapy.  · Pain relieving medications.  Your caregiver will help you understand the likelihood that any particular treatment will be helpful for you. While some treatments aim to cure or control the cancer, others give relief from symptoms only. If you have bone metastases, radiation therapy may be recommended to treat pain (if it is in one main location). Pain medications are available. These include strong medicines like morphine. You may be instructed to take a long-acting pain medication (to control most of your pain) and a short-acting medication to control occasional flares of pain. Pain medication is sometimes also given continuously through a pump.  HOME CARE INSTRUCTIONS   · Take medications exactly as prescribed.  · Keep any   follow-up appointments.  · Pain medications can make you sleepy or confused. Do not drive, climb ladders, or do other dangerous activities while on pain medication.  · Pain medications often cause constipation. Ask your caregiver for information on stool softeners.  · Do not share your pain medication with others.  SEEK MEDICAL CARE IF:   · Your bone pain is not controlled.  · You are having  problems or side effects from your medication.  · You have excessive sleepiness or confusion.  SEEK IMMEDIATE MEDICAL CARE IF:   · You fall and have any injury or pain from the fall.  · You have trouble walking.  · You have numbness or tingling in your legs.  · You develop a sudden significant worsening of your pain.  Document Released: 11/15/2002 Document Revised: 02/17/2012 Document Reviewed: 07/08/2008  ExitCare® Patient Information ©2015 [employer]. This information is not intended to replace advice given to you by your health care provider. Make sure you discuss any questions you have with your health care provider.

## 2015-06-24 NOTE — ED Notes (Signed)
Erling Conte, Liaison from [facility] stopped by to visit pt.  Contact information is [phone]. Report should be given to [phone]

## 2015-06-24 NOTE — ED Notes (Signed)
Discharge summary faxed to [facility] at [phone].  Phone call complete to [facility], report given to [place]. Levada Dy made aware pt is being discharged.  Phone call complete to Erling Conte, Liaison made aware pt is being discharged.

## 2015-06-25 LAB — URINE CULTURE

## 2015-07-03 ENCOUNTER — Institutional Professional Consult (permissible substitution): Payer: Medicare Other | Admitting: Radiation Oncology

## 2015-07-03 ENCOUNTER — Ambulatory Visit: Payer: Medicare Other

## 2015-07-05 ENCOUNTER — Telehealth: Payer: Self-pay

## 2015-07-05 ENCOUNTER — Other Ambulatory Visit (HOSPITAL_COMMUNITY)
Admission: RE | Admit: 2015-07-05 | Discharge: 2015-07-05 | Disposition: A | Discharge status: Home / Self Care | Payer: Medicare Other | Source: Ambulatory Visit | Attending: Internal Medicine | Admitting: Internal Medicine

## 2015-07-05 DIAGNOSIS — C787 Secondary malignant neoplasm of liver and intrahepatic bile duct: Secondary | ICD-10-CM | POA: Insufficient documentation

## 2015-07-05 NOTE — Telephone Encounter (Signed)
Wife calling requesting biopsy report. Husband has since passed. He was at hospice facility on [address]. She is asking for a copy of the report, I mailed report. Will forward call to Dr Julien Nordmann, and to medical records.

## 2015-07-10 ENCOUNTER — Telehealth: Payer: Self-pay | Admitting: Internal Medicine

## 2015-07-10 ENCOUNTER — Other Ambulatory Visit: Payer: Self-pay | Admitting: Medical Oncology

## 2015-07-10 NOTE — Telephone Encounter (Signed)
Per pof pt passed...cx all appts

## 2015-07-10 DEATH — deceased

## 2015-07-12 ENCOUNTER — Ambulatory Visit: Payer: Medicare Other | Admitting: Internal Medicine

## 2015-07-12 ENCOUNTER — Other Ambulatory Visit: Payer: Medicare Other

## 2015-07-13 ENCOUNTER — Telehealth: Payer: Self-pay | Admitting: Medical Oncology

## 2015-07-13 NOTE — Telephone Encounter (Signed)
Died 07/26/15

## 2015-07-17 ENCOUNTER — Ambulatory Visit: Payer: Medicare Other | Admitting: Podiatry

## 2015-07-18 ENCOUNTER — Encounter (HOSPITAL_COMMUNITY): Payer: Self-pay

## 2015-10-22 IMAGING — PT NM PET TUM IMG INITIAL (PI) SKULL BASE T - THIGH
7 series · 25 of 25 positions shown · non-contrast
Comparison: Multiple exams, including 05/31/2015

CLINICAL DATA: Initial treatment strategy for left apical Pancoast
tumor with pleural implants, os paraspinal soft tissue invasion, rib
erosions, and suspected metastatic disease to the left lung, adrenal
glands, liver, and possibly spleen.

EXAM:
NUCLEAR MEDICINE PET SKULL BASE TO THIGH
TECHNIQUE: 7.3 mCi F-18 FDG was injected intravenously. Full-ring PET imaging
was performed from the skull base to thigh after the radiotracer. CT
data was obtained and used for attenuation correction and anatomic
localization.
FASTING BLOOD GLUCOSE:  Value: 111 mg/dl

[Series 3: pet sk_thigh ac · axial · 5.0mm · 4.07mm/px · z∈[-908,-72]mm · 6 of 210 slices shown]
[im 1/210]
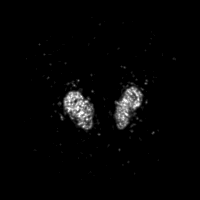
[im 42/210]
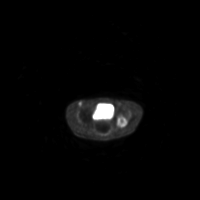
[im 84/210]
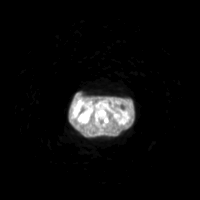
[im 126/210]
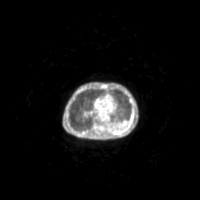
[im 168/210]
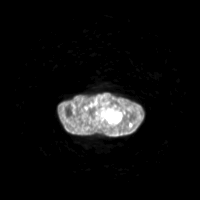
[im 210/210]
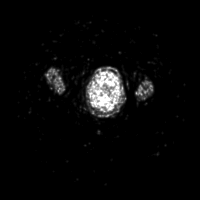

[Series 4: ct sk_thigh 5.0 b31f · axial · 5.0mm · 0.85mm/px · z∈[-908,-72]mm · 5 of 210 slices shown]
[im 1/210]
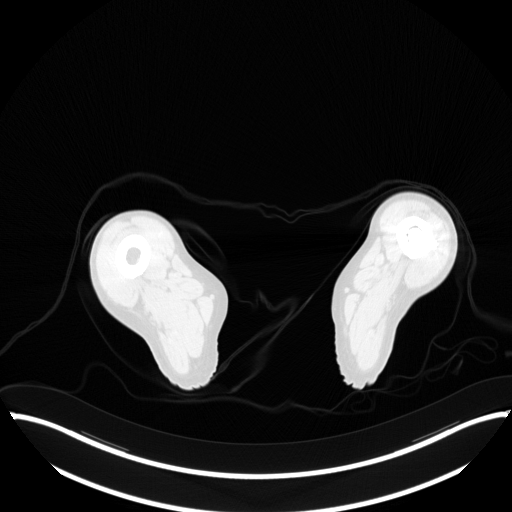
[im 53/210]
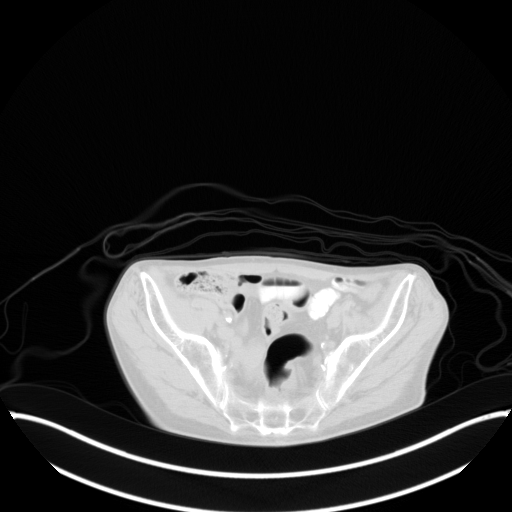
[im 105/210]
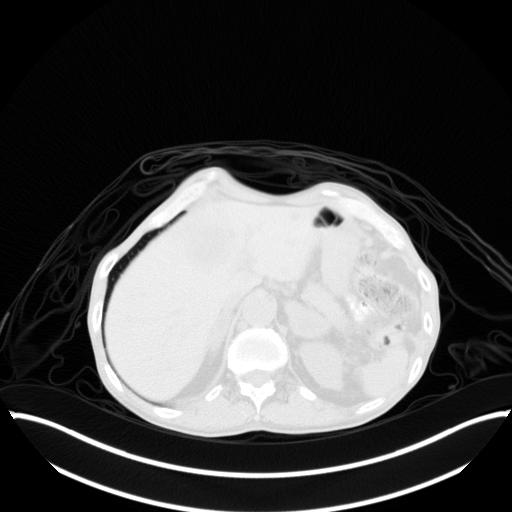
[im 157/210]
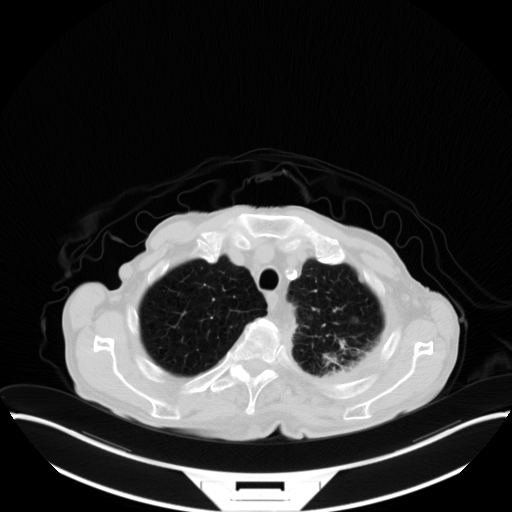
[im 210/210  brain]
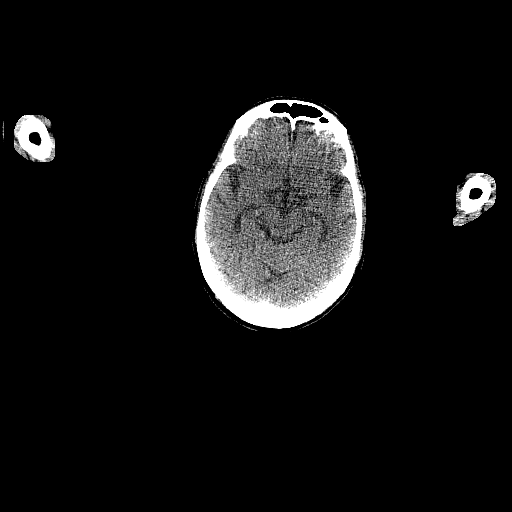

[Series 7: pet sk_thigh nac · axial · 5.0mm · 4.07mm/px · z∈[-908,-72]mm · 5 of 210 slices shown]
[im 1/210]
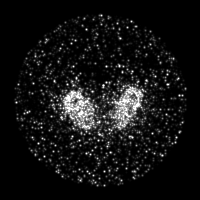
[im 53/210]
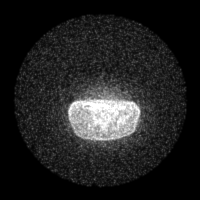
[im 105/210]
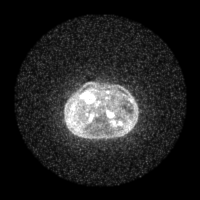
[im 157/210]
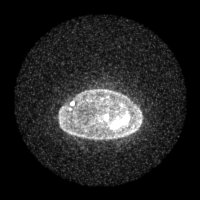
[im 210/210]
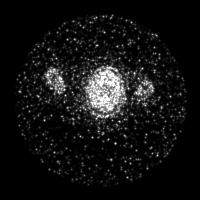

[Series 9: ct sk_thigh 5.0 b70f lung_(id) · axial · 5.0mm · 0.66mm/px · z∈[-516,-224]mm · 2 of 74 slices shown]
[im 1/74  bone]
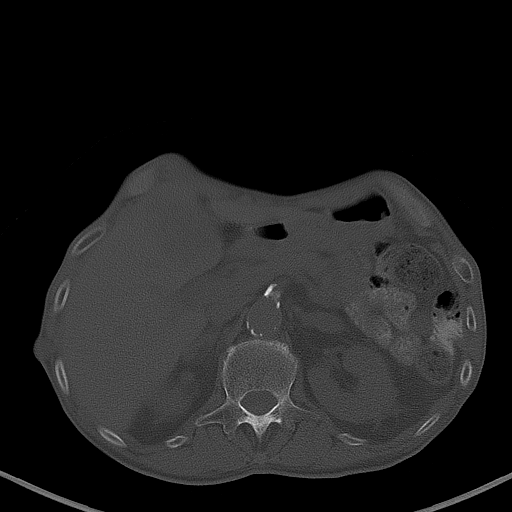
[im 74/74  bone]
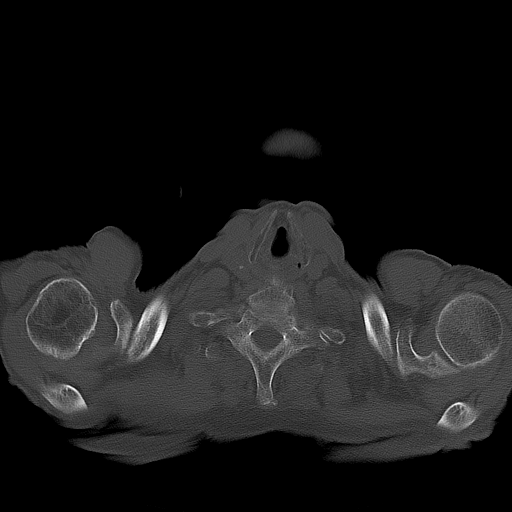

[Series 603: mip collection<mip range> · coronal · 1.74mm/px · 1 of 32 slices shown]
[im 1/32]
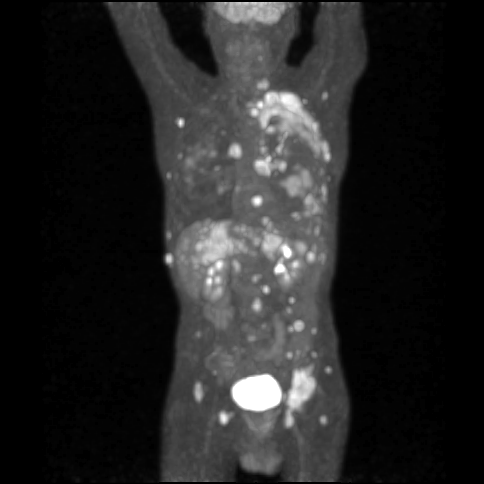

[Series 604: range-ct sk_thigh 5.0 (id)<alpha range> · 1 of 58 slices shown (1 of 2)]
[im 1/58]
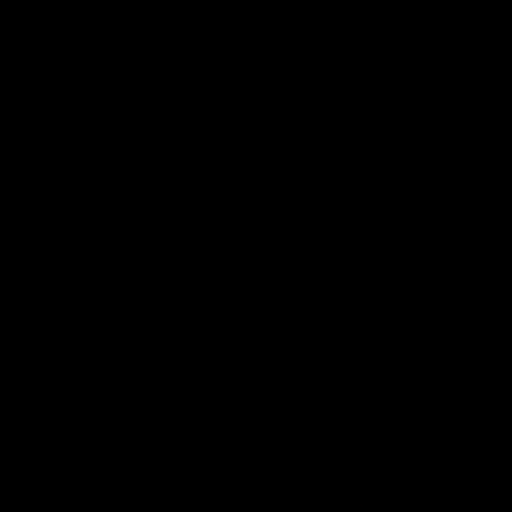

[Series 605: range-ct sk_thigh 5.0 (id)<alpha range> · 5 of 194 slices shown (2 of 2)]
[im 1/194]
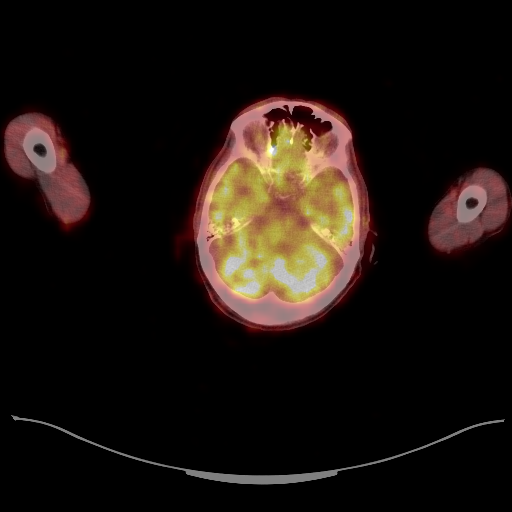
[im 49/194]
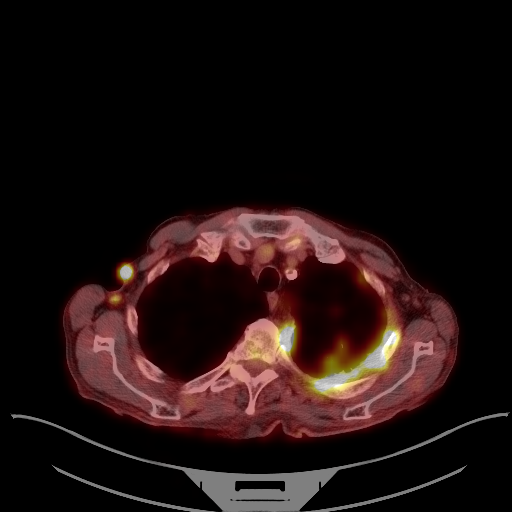
[im 97/194]
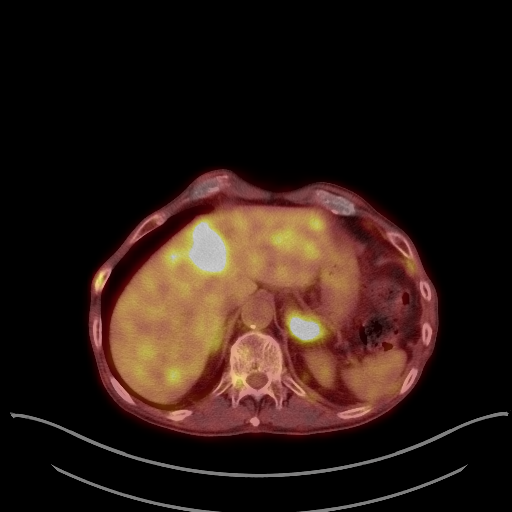
[im 145/194]
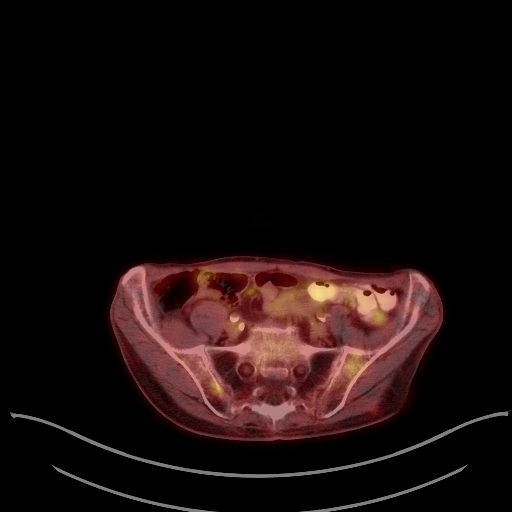
[im 194/194]
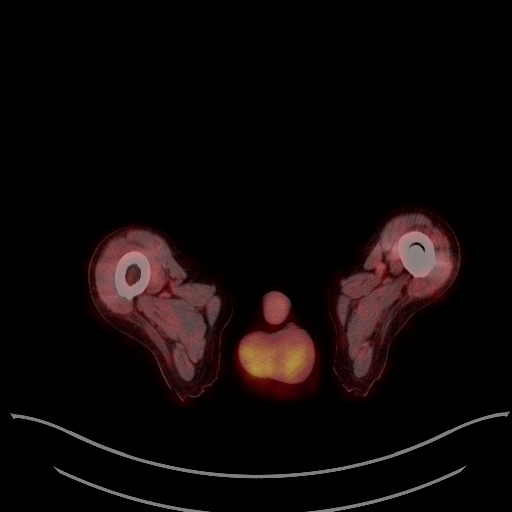

[25 of 25 positions shown; findings below may reference images not displayed]

FINDINGS: NECK

In the vicinity of the left rotatores cervicis muscle at about the
C4 level, there is day focus of hypermetabolic activity with maximum
SUV 3.8.

In the vicinity of the left C7-T1 neural foramen and adjacent
lateral extraforaminal space.

A left lateral supraclavicular lymph node measuring 8 mm in short
axis on image 45 series 4 has maximum standard uptake value 4.5.
There is an abnormal focus of hypermetabolic activity measuring 6.7.

CHEST

The left apical Pancoast tumor invading the chest wall and the top
left 3 ribs is highly hypermetabolic and may extend into the left
T2-3 neural foraminal region, maximum standard uptake value 16.0.
Tumor related to this mass extends in the left pleural space,
causing a pleural rind. Bilateral hypermetabolic axillary lymph
nodes are present along with small hypermetabolic mediastinal lymph
nodes, potential metastatic lesions in the atelectatic left lower
lobe, and left hilar and infrahilar nodes. There are hypermetabolic
lesions in the right and left chest wall/subcutaneous tissues, as
well as osseous metastatic lesions involving the ribs, upper
thoracic vertebra, and left scapular spine. An index right axillary
lymph node measures 1 cm in short axis on image 55 series 4 and has
maximum standard uptake value of 13.7.

ABDOMEN/PELVIS

Extensive metastatic burden of the abdomen including bilateral
adrenal metastatic lesions, scattered hepatic metastatic lesions,
hypermetabolic peripancreatic adenopathy, a left upper pole renal
capsular/ carry renal metastatic lesion, a right perirenal space
hypermetabolic lesion above the right kidney, left upper quadrant
mesenteric metastatic lesions; subcutaneous metastatic lesions along
the anterior abdomen and left posterior buttock, as well as
musculoskeletal metastatic lesions.

As an index lesion, the left adrenal mass measures 4 point far by
2.7 cm on image 107 of series 4 and has maximum standard uptake
value 9.8. The dominant segment 4 mass in the liver measures 5.9 by
4.5 cm on image 109 of series 4 and has maximum standard uptake
value 10.0. The left carry renal metastatic focus has maximum
standard uptake value of 7.4. The left lower quadrant anterior
abdominal wall subcutaneous mass just below the level of the iliac
crests measures 2.1 by 1.4 cm on image 142 of series 4 and has
maximum standard uptake value of 8.9.

No definite splenic lesion.

SKELETON

Osseous metastatic lesions involving the ribs, left scapula, various
vertebra, bony pelvis, and right psoas muscle. Among these, some of
the more significant lesions include a left eccentric T10 posterior
vertebral body lesion which could have an epidural component,
maximum standard uptake value 16.9; a destructive left posterior
acetabular wall mass which may be eroding into the joint and is
causing significant breakdown of the posterior wall with
extraosseous spread, maximum standard uptake value 13.5 ; and a
right iloipsoas metastatic lesion measuring with maximum standard
uptake value 7.6.
IMPRESSION: 1. Left Pancoast tumor with extensive metastatic disease to the
skeleton, thoracoabdominal subcutaneous tissues, muscular tissues,
left pleural surface, both adrenal glands, liver, left upper
quadrant mesentery, and perirenal spaces.
2. Some of the lesions involve the spine and some are along the
neural foramina/epidural margin in the cervical and thoracic spine.
